# Patient Record
Sex: Male | Born: 1940 | Race: White | Hispanic: No | Marital: Married | Smoking: Never smoker
Health system: Southern US, Community
[De-identification: ages and names within clinical notes are randomized; demographics above are authoritative.]

## PROBLEM LIST (undated history)

## (undated) DIAGNOSIS — H409 Unspecified glaucoma: Secondary | ICD-10-CM

## (undated) DIAGNOSIS — C3491 Malignant neoplasm of unspecified part of right bronchus or lung: Secondary | ICD-10-CM

## (undated) DIAGNOSIS — N200 Calculus of kidney: Secondary | ICD-10-CM

## (undated) HISTORY — PX: OTHER SURGICAL HISTORY: SHX169

---

## 1999-04-03 ENCOUNTER — Ambulatory Visit (HOSPITAL_COMMUNITY): Admission: RE | Admit: 1999-04-03 | Discharge: 1999-04-03 | Payer: Self-pay | Admitting: Gastroenterology

## 2001-09-28 ENCOUNTER — Encounter: Payer: Self-pay | Admitting: Urology

## 2001-09-28 ENCOUNTER — Encounter: Admission: RE | Admit: 2001-09-28 | Discharge: 2001-09-28 | Payer: Self-pay | Admitting: Urology

## 2001-11-10 ENCOUNTER — Ambulatory Visit: Admission: RE | Admit: 2001-11-10 | Discharge: 2002-02-08 | Payer: Self-pay | Admitting: Radiation Oncology

## 2002-01-12 ENCOUNTER — Encounter: Admission: RE | Admit: 2002-01-12 | Discharge: 2002-01-12 | Payer: Self-pay | Admitting: Radiation Oncology

## 2002-05-01 ENCOUNTER — Ambulatory Visit: Admission: RE | Admit: 2002-05-01 | Discharge: 2002-05-01 | Payer: Self-pay | Admitting: Radiation Oncology

## 2002-11-17 ENCOUNTER — Ambulatory Visit (HOSPITAL_COMMUNITY): Admission: RE | Admit: 2002-11-17 | Discharge: 2002-11-17 | Payer: Self-pay | Admitting: Radiation Oncology

## 2003-02-26 ENCOUNTER — Ambulatory Visit (HOSPITAL_COMMUNITY): Admission: RE | Admit: 2003-02-26 | Discharge: 2003-02-26 | Payer: Self-pay | Admitting: Radiation Oncology

## 2003-02-26 ENCOUNTER — Encounter: Payer: Self-pay | Admitting: Radiation Oncology

## 2003-06-11 ENCOUNTER — Ambulatory Visit: Admission: RE | Admit: 2003-06-11 | Discharge: 2003-06-11 | Payer: Self-pay | Admitting: Radiation Oncology

## 2004-04-02 ENCOUNTER — Ambulatory Visit (HOSPITAL_COMMUNITY): Admission: RE | Admit: 2004-04-02 | Discharge: 2004-04-02 | Payer: Self-pay | Admitting: Gastroenterology

## 2007-12-27 ENCOUNTER — Encounter: Admission: RE | Admit: 2007-12-27 | Discharge: 2007-12-27 | Payer: Self-pay | Admitting: Family Medicine

## 2008-08-31 ENCOUNTER — Encounter: Admission: RE | Admit: 2008-08-31 | Discharge: 2008-08-31 | Payer: Self-pay | Admitting: Family Medicine

## 2009-11-22 ENCOUNTER — Ambulatory Visit (HOSPITAL_BASED_OUTPATIENT_CLINIC_OR_DEPARTMENT_OTHER): Admission: RE | Admit: 2009-11-22 | Discharge: 2009-11-22 | Payer: Self-pay | Admitting: Urology

## 2010-09-22 LAB — POCT HEMOGLOBIN-HEMACUE: Hemoglobin: 16.5 g/dL (ref 13.0–17.0)

## 2010-11-21 NOTE — Op Note (Signed)
NAME:  Wesley Coleman, Wesley Coleman NO.:  [id number]   MEDICAL RECORD NO.:  [id number]          PATIENT TYPE:  AMB   LOCATION:  ENDO                         FACILITY:  [facility]   PHYSICIAN:  James L. Malon Kindle., M.D.DATE OF BIRTH:  August 02, 1940   DATE OF PROCEDURE:  04/02/2004  DATE OF DISCHARGE:                                 OPERATIVE REPORT   PROCEDURE:  Colonoscopy.   MEDICATIONS:  Fentanyl 100 mcg, Versed 10 mg IV.   INDICATIONS:  Family history of colon cancer.   DESCRIPTION OF PROCEDURE:  Procedure had been explained to the patient and  consent obtained. Placed in the left lateral decubitus position, and Olympus  scope was inserted and advanced. Prep was excellent. Advanced around to the  cecum, and the ileocecal valve and appendiceal orifice were seen. The scope  was withdrawn to the cecum, ascending colon, transverse colon, splenic  flexure, and descending  and sigmoid colon were seen well and no polyps were  seen. Scope was withdrawn, and the patient tolerated the procedure well.   ASSESSMENT:  No polyps ______________ strong family history of colon cancer,  V16.0 .   PLAN:  Will recommend yearly hemoccults in view of his mother having had  colon cancer and polyps. Will recommend a 5 year repeat colonoscopy.      JLE/MEDQ  D:  04/02/2004  T:  04/02/2004  Job:  [id number]   cc:   Duncan Dull, M.D.  [address]  [place]  [place] [address]  Fax: [phone]

## 2011-12-20 ENCOUNTER — Emergency Department (HOSPITAL_COMMUNITY): Payer: Medicare Other

## 2011-12-20 ENCOUNTER — Encounter (HOSPITAL_COMMUNITY): Payer: Self-pay | Admitting: Emergency Medicine

## 2011-12-20 ENCOUNTER — Emergency Department (HOSPITAL_COMMUNITY)
Admission: EM | Admit: 2011-12-20 | Discharge: 2011-12-20 | Disposition: A | Discharge status: Home / Self Care | Payer: Medicare Other | Attending: Emergency Medicine | Admitting: Emergency Medicine

## 2011-12-20 DIAGNOSIS — R102 Pelvic and perineal pain: Secondary | ICD-10-CM

## 2011-12-20 DIAGNOSIS — N200 Calculus of kidney: Secondary | ICD-10-CM | POA: Insufficient documentation

## 2011-12-20 DIAGNOSIS — R109 Unspecified abdominal pain: Secondary | ICD-10-CM | POA: Insufficient documentation

## 2011-12-20 HISTORY — DX: Unspecified glaucoma: H40.9

## 2011-12-20 HISTORY — DX: Calculus of kidney: N20.0

## 2011-12-20 LAB — DIFFERENTIAL
Basophils Absolute: 0 10*3/uL (ref 0.0–0.1)
Basophils Relative: 0 % (ref 0–1)
Lymphocytes Relative: 22 % (ref 12–46)
Neutro Abs: 3.3 10*3/uL (ref 1.7–7.7)
Neutrophils Relative %: 67 % (ref 43–77)

## 2011-12-20 LAB — CBC
HCT: 44.3 % (ref 39.0–52.0)
Hemoglobin: 15.7 g/dL (ref 13.0–17.0)
MCHC: 35.4 g/dL (ref 30.0–36.0)
RDW: 12.2 % (ref 11.5–15.5)
WBC: 4.9 10*3/uL (ref 4.0–10.5)

## 2011-12-20 LAB — BASIC METABOLIC PANEL
CO2: 25 mEq/L (ref 19–32)
Calcium: 9.4 mg/dL (ref 8.4–10.5)
Chloride: 104 mEq/L (ref 96–112)
Creatinine, Ser: 1 mg/dL (ref 0.50–1.35)
GFR calc non Af Amer: 74 mL/min — ABNORMAL LOW (ref >90)
Potassium: 3.8 mEq/L (ref 3.5–5.1)

## 2011-12-20 LAB — URINALYSIS, ROUTINE W REFLEX MICROSCOPIC
Bilirubin Urine: NEGATIVE
Hgb urine dipstick: NEGATIVE
Protein, ur: NEGATIVE mg/dL
Urobilinogen, UA: 0.2 mg/dL (ref 0.0–1.0)

## 2011-12-20 MED ORDER — KETOROLAC TROMETHAMINE 30 MG/ML IJ SOLN
30.0000 mg | Freq: Once | INTRAMUSCULAR | Status: AC
  Administered 2011-12-20: 30 mg via INTRAVENOUS
  Filled 2011-12-20: qty 1

## 2011-12-20 NOTE — ED Provider Notes (Signed)
History     CSN: [id number]  Arrival date & time 12/20/11  7829   First MD Initiated Contact with Patient 12/20/11 [phone]      Chief Complaint  Patient presents with  . Groin Pain    (Consider location/radiation/quality/duration/timing/severity/associated sxs/prior treatment) Patient is a 71 y.o. male presenting with groin pain. The history is provided by the patient.  Groin Pain  He has a history of having had multiple kidney stones in the past. 4 days ago, he noted some burning in the suprapubic area which was mild to moderate. He rated at a 4/10. There was associated urinary hesitancy but no dysuria. The pain subsided but is still present and he rates the pain at 2/10 currently. He has noted over the last 24 hours that he has worsening urinary hesitancy and some drooling. He continues to deny dysuria. Denies any flank pain. There has been no nausea or vomiting and no fever or chills. He has not taken any medication. Nothing makes his pain better nothing makes it worse.  Past Medical History  Diagnosis Date  . Kidney stones   . Glaucoma     History reviewed. No pertinent past surgical history.  History reviewed. No pertinent family history.  History  Substance Use Topics  . Smoking status: Not on file  . Smokeless tobacco: Not on file  . Alcohol Use:       Review of Systems  All other systems reviewed and are negative.    Allergies  Dye fdc red; Erythromycin; and Penicillins  Home Medications   Current Outpatient Rx  Name Route Sig Dispense Refill  . CLONAZEPAM 2 MG PO TABS Oral Take 2 mg by mouth at bedtime as needed. For sleep    . PRAVASTATIN SODIUM 10 MG PO TABS Oral Take 10 mg by mouth daily. Take one tablet every other day    . TIMOLOL MALEATE 0.5 % OP SOLN Right Eye Place 1 drop into the right eye 2 (two) times daily.      BP 110/75  Pulse 72  Temp 97.4 F (36.3 C) (Oral)  Resp 18  SpO2 94%  Physical Exam  Nursing note and vitals reviewed.  71 year old male who is resting comfortably and in no acute distress. Vital signs are normal. Oxygen saturation is 94% which is normal. Head is normocephalic and atraumatic. PERRLA, EOMI. Neck is nontender and supple. Back is nontender. Lungs are clear without rales, wheezes, or rhonchi. Heart has regular rate and rhythm without murmur. Abdomen is soft, flat, nontender without masses or hepatosplenomegaly. There is no CVA tenderness. There is no inguinal adenopathy. Jimmy's have no cyanosis or edema, full range of motion is present. Skin is warm and dry without rash. Neurologic: Mental status is normal, cranial nerves are intact, there are no motor or sensory deficits.  ED Course  Procedures (including critical care time)  Results for orders placed during the hospital encounter of 12/20/11  CBC      Component Value Range   WBC 4.9  4.0 - 10.5 K/uL   RBC 4.88  4.22 - 5.81 MIL/uL   Hemoglobin 15.7  13.0 - 17.0 g/dL   HCT 30.8  65.7 - 84.6 %   MCV 90.8  78.0 - 100.0 fL   MCH 32.2  26.0 - 34.0 pg   MCHC 35.4  30.0 - 36.0 g/dL   RDW 96.2  95.2 - 84.1 %   Platelets 220  150 - 400 K/uL  DIFFERENTIAL  Component Value Range   Neutrophils Relative 67  43 - 77 %   Neutro Abs 3.3  1.7 - 7.7 K/uL   Lymphocytes Relative 22  12 - 46 %   Lymphs Abs 1.1  0.7 - 4.0 K/uL   Monocytes Relative 9  3 - 12 %   Monocytes Absolute 0.4  0.1 - 1.0 K/uL   Eosinophils Relative 2  0 - 5 %   Eosinophils Absolute 0.1  0.0 - 0.7 K/uL   Basophils Relative 0  0 - 1 %   Basophils Absolute 0.0  0.0 - 0.1 K/uL  BASIC METABOLIC PANEL      Component Value Range   Sodium 138  135 - 145 mEq/L   Potassium 3.8  3.5 - 5.1 mEq/L   Chloride 104  96 - 112 mEq/L   CO2 25  19 - 32 mEq/L   Glucose, Bld 104 (*) 70 - 99 mg/dL   BUN 11  6 - 23 mg/dL   Creatinine, Ser 1.61  0.50 - 1.35 mg/dL   Calcium 9.4  8.4 - 09.6 mg/dL   GFR calc non Af Amer 74 (*) >90 mL/min   GFR calc Af Amer 86 (*) >90 mL/min  URINALYSIS, ROUTINE W  REFLEX MICROSCOPIC      Component Value Range   Color, Urine YELLOW  YELLOW   APPearance CLEAR  CLEAR   Specific Gravity, Urine 1.009  1.005 - 1.030   pH 6.5  5.0 - 8.0   Glucose, UA NEGATIVE  NEGATIVE mg/dL   Hgb urine dipstick NEGATIVE  NEGATIVE   Bilirubin Urine NEGATIVE  NEGATIVE   Ketones, ur NEGATIVE  NEGATIVE mg/dL   Protein, ur NEGATIVE  NEGATIVE mg/dL   Urobilinogen, UA 0.2  0.0 - 1.0 mg/dL   Nitrite NEGATIVE  NEGATIVE   Leukocytes, UA NEGATIVE  NEGATIVE   Ct Abdomen Pelvis Wo Contrast  12/20/2011  *RADIOLOGY REPORT*  Clinical Data: Groin pain.  Prior prostate cancer with radiation therapy.  Suprapubic pain and dysuria.  History of kidney stones.  CT ABDOMEN AND PELVIS WITHOUT CONTRAST  Technique:  Multidetector CT imaging of the abdomen and pelvis was performed following the standard protocol without intravenous contrast.  Comparison: Abdominal radiograph 09/01/2011  Findings: Minimal dependent bilateral atelectasis.  Lung bases are otherwise clear.  Bilateral nonobstructing renal calculi are again noted, largest in the left mid kidney measuring 7 mm image 32 and largest right mid kidney measuring 8 mm image 34.  No perinephric fluid collection. Minimal nonspecific perinephric stranding is noted.  No hydroureteronephrosis.  The bladder is normal.  No radiopaque ureteral or bladder calculus.  Unenhanced liver, gallbladder, adrenal glands, spleen, and pancreas are normal.  No ascites or lymphadenopathy.  No free air.  Bladder and bowel are unremarkable.  Appendix is normal.  Lumbar spine degenerative changes is present.  No acute osseous finding. No lytic or sclerotic osseous lesion.  IMPRESSION: No acute intra-abdominal or pelvic pathology.  Bilateral radiopaque nonobstructing renal calculi.  Nonspecific perinephric stranding.  Original Report Authenticated By: Harrel Lemon, M.D.      1. Pelvic pain       MDM  Suprapubic pain which may indeed represent ureterolithiasis but  need to consider possibility of urinary tract infection. Urinalysis will be obtained as well CT scan. I reviewed his prior imaging and he does have known kidney stones bilaterally. Plain abdominal films show multiple calcifications so I do not believe that plain film will be sufficient to assess whether  his symptoms are due to ureterolithiasis.   Workup is unremarkable. No evidence of nephrolithiasis, ureterolithiasis, or urinary tract infection. He is referred back to his urologist for further evaluation.     Dione Booze, MD 12/20/11 1159

## 2011-12-20 NOTE — ED Notes (Signed)
Pt c/o groin pain x 1 week, worse today.  Pt reports h/o kidney stones and states this pain feels the same.

## 2011-12-20 NOTE — Discharge Instructions (Signed)
Your CAT scan did not show any evidence of a kidney stone, and your urine did not show any evidence of infection. Your blood tests were all normal. I don't have an explanation for why you're having your pain and trouble urinating. Please make an appointment with your urologist for further evaluation, and return to the emergency department if symptoms get worse.

## 2011-12-22 ENCOUNTER — Ambulatory Visit (INDEPENDENT_AMBULATORY_CARE_PROVIDER_SITE_OTHER): Payer: Medicare Other | Admitting: Urology

## 2011-12-22 DIAGNOSIS — N200 Calculus of kidney: Secondary | ICD-10-CM

## 2011-12-22 DIAGNOSIS — C61 Malignant neoplasm of prostate: Secondary | ICD-10-CM

## 2011-12-22 DIAGNOSIS — N210 Calculus in bladder: Secondary | ICD-10-CM

## 2012-09-20 ENCOUNTER — Ambulatory Visit (INDEPENDENT_AMBULATORY_CARE_PROVIDER_SITE_OTHER): Payer: Medicare Other | Admitting: Urology

## 2012-09-20 DIAGNOSIS — N529 Male erectile dysfunction, unspecified: Secondary | ICD-10-CM

## 2012-09-20 DIAGNOSIS — C61 Malignant neoplasm of prostate: Secondary | ICD-10-CM

## 2013-05-04 ENCOUNTER — Ambulatory Visit: Payer: Medicare Other | Admitting: Interventional Cardiology

## 2013-05-04 ENCOUNTER — Telehealth: Payer: Self-pay | Admitting: Interventional Cardiology

## 2013-05-04 NOTE — Telephone Encounter (Signed)
New Problem:  Pt states he missed a call and he wasn't sure if Amy was trying to call him. I informed the pt of his appt. Please advise

## 2013-05-04 NOTE — Telephone Encounter (Signed)
Called pt back and let him know I didn't call him but that he does have an appt on 05/09/13.

## 2013-05-08 DIAGNOSIS — N200 Calculus of kidney: Secondary | ICD-10-CM | POA: Insufficient documentation

## 2013-05-08 DIAGNOSIS — H409 Unspecified glaucoma: Secondary | ICD-10-CM | POA: Insufficient documentation

## 2013-05-09 ENCOUNTER — Encounter: Payer: Self-pay | Admitting: Interventional Cardiology

## 2013-05-09 ENCOUNTER — Ambulatory Visit: Payer: Medicare Other | Admitting: Interventional Cardiology

## 2013-05-09 ENCOUNTER — Ambulatory Visit (INDEPENDENT_AMBULATORY_CARE_PROVIDER_SITE_OTHER): Payer: Medicare Other | Admitting: Interventional Cardiology

## 2013-05-09 VITALS — BP 126/74 | HR 73 | Ht 68.5 in | Wt 173.0 lb

## 2013-05-09 DIAGNOSIS — R0602 Shortness of breath: Secondary | ICD-10-CM

## 2013-05-09 DIAGNOSIS — R9430 Abnormal result of cardiovascular function study, unspecified: Secondary | ICD-10-CM | POA: Insufficient documentation

## 2013-05-09 DIAGNOSIS — Z0389 Encounter for observation for other suspected diseases and conditions ruled out: Secondary | ICD-10-CM

## 2013-05-09 DIAGNOSIS — E782 Mixed hyperlipidemia: Secondary | ICD-10-CM

## 2013-05-09 DIAGNOSIS — R079 Chest pain, unspecified: Secondary | ICD-10-CM

## 2013-05-09 NOTE — Patient Instructions (Signed)
Your physician recommends that you continue on your current medications as directed. Please refer to the Current Medication list given to you today.  Your physician wants you to follow-up in: 1 year with Dr. Varanasi. You will receive a reminder letter in the mail two months in advance. If you don't receive a letter, please call our office to schedule the follow-up appointment.  

## 2013-05-09 NOTE — Progress Notes (Signed)
Patient ID: Wesley Coleman, male   DOB: Apr 30, 1941, 72 y.o.   MRN: [id number]    [address] [place], [place]  [address] Phone: [phone] Fax:  [phone]  Date:  05/09/2013   ID:  Wesley Coleman, DOB December 14, 1940, MRN [id number]  PCP:  Ginette Otto, MD      History of Present Illness: Wesley Coleman is a 72 y.o. male who has had an abnormal stress test in the past. He has some difficulty breathing at times. He does walk and jogs in place at times. He has some upper abdominal pain that resolves as he continues to exercise. He has noted some irregular heart beats. He has decreased his caffeine intake. He walks several days a week.  He is concerned that pravastatin is causing his side effects. On rare occasions,he has some CP with walking, but this is relieved while he continues to walk. Rare palpitations.  He has been Environmental consultant.  He fell of of a ladder recently. No broken bones.     Wt Readings from Last 3 Encounters:  05/09/13 173 lb (78.472 kg)     Past Medical History  Diagnosis Date  . Kidney stones   . Glaucoma     Current Outpatient Prescriptions  Medication Sig Dispense Refill  . clonazePAM (KLONOPIN) 2 MG tablet Take 1 mg by mouth at bedtime as needed. For sleep      . HYDROcodone-acetaminophen (NORCO) 10-325 MG per tablet Take 1 tablet by mouth every 6 (six) hours as needed.      . pravastatin (PRAVACHOL) 10 MG tablet Take 10 mg by mouth daily. Take one tablet every other day      . timolol (TIMOPTIC) 0.5 % ophthalmic solution Place 1 drop into the right eye 2 (two) times daily.       No current facility-administered medications for this visit.    Allergies:    Allergies  Allergen Reactions  . Dye Fdc Red [Red Dye]     Had reaction to dye treatment from kidney stones  . Erythromycin Nausea And Vomiting  . Penicillins Hives    Social History:  The patient  reports that he has never smoked. He does not have any smokeless tobacco  history on file. He reports that he drinks alcohol. He reports that he does not use illicit drugs.   Family History:  The patient's family history includes Colon cancer in his mother; Diabetes in his paternal grandmother; Heart Problems in his maternal grandmother.   ROS:  Please see the history of present illness.  No nausea, vomiting.  No fevers, chills.  No focal weakness.  No dysuria.   All other systems reviewed and negative.   PHYSICAL EXAM: VS:  BP 126/74  Pulse 73  Ht 5' 8.5" (1.74 m)  Wt 173 lb (78.472 kg)  BMI 25.92 kg/m2 Well nourished, well developed, in no acute distress HEENT: normal Neck: no JVD, no carotid bruits Cardiac:  normal S1, S2; RRR;  Lungs:  clear to auscultation bilaterally, no wheezing, rhonchi or rales Abd: soft, nontender, no hepatomegaly, no pulsatile mass Ext: no edema Skin: warm and dry Neuro:   no focal abnormalities noted  EKG:  Normal     ASSESSMENT AND PLAN:  Shortness of breath on exertion  retested for ischemia in 11/13.  Still with question of basal to mid inferior ischemia by stress test. We've not pursued cardiac cath because his symptoms have been minimal and managed with medicines. COntinue  to try to be active. LDL 100 in 10/13. LDL 83 in 10/14. Only takng pravastatin 3-4 x /week. SHOB not limiting him. 2. Abnormal cardiovascular function study  Continue Aspirin Tablet, 81 MG, 1 tablet, Orally, Occ. 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 No significant exertion sx.  Still not interested in cath at this time. 3. Palpitations  Getting better. Continue to avoid caffeine. Sx resolved. Rare, only with excessive caffeine. 4. Chest pain, unspecified  No significant exertional pain.  He does have some chest pain in the left arm pit; and some shortlived chest pain with anxiety.  Atypical.  We discussed cardiac cath but he does not want to pursue that now.    Signed, Fredric Mare, MD, [facility] 05/09/2013 10:49 AM

## 2013-07-13 ENCOUNTER — Other Ambulatory Visit: Payer: Self-pay | Admitting: Geriatric Medicine

## 2013-07-13 ENCOUNTER — Ambulatory Visit
Admission: RE | Admit: 2013-07-13 | Discharge: 2013-07-13 | Disposition: A | Discharge status: Home / Self Care | Payer: Medicare Other | Source: Ambulatory Visit | Attending: Geriatric Medicine | Admitting: Geriatric Medicine

## 2013-07-13 DIAGNOSIS — M549 Dorsalgia, unspecified: Secondary | ICD-10-CM

## 2013-07-20 ENCOUNTER — Other Ambulatory Visit: Payer: Self-pay | Admitting: Geriatric Medicine

## 2013-07-20 ENCOUNTER — Ambulatory Visit
Admission: RE | Admit: 2013-07-20 | Discharge: 2013-07-20 | Disposition: A | Discharge status: Home / Self Care | Payer: Medicare Other | Source: Ambulatory Visit | Attending: Geriatric Medicine | Admitting: Geriatric Medicine

## 2013-07-20 DIAGNOSIS — M25552 Pain in left hip: Secondary | ICD-10-CM

## 2013-07-20 DIAGNOSIS — M545 Low back pain, unspecified: Secondary | ICD-10-CM

## 2013-07-20 DIAGNOSIS — R0781 Pleurodynia: Secondary | ICD-10-CM

## 2013-07-20 DIAGNOSIS — M898X5 Other specified disorders of bone, thigh: Secondary | ICD-10-CM

## 2013-07-20 DIAGNOSIS — W19XXXA Unspecified fall, initial encounter: Secondary | ICD-10-CM

## 2014-01-09 ENCOUNTER — Telehealth: Payer: Self-pay | Admitting: Interventional Cardiology

## 2014-01-09 ENCOUNTER — Telehealth: Payer: Self-pay | Admitting: *Deleted

## 2014-01-09 MED ORDER — PRAVASTATIN SODIUM 20 MG PO TABS: ORAL_TABLET | ORAL | Status: DC

## 2014-01-09 MED ORDER — ASPIRIN EC 81 MG PO TBEC
81.0000 mg | DELAYED_RELEASE_TABLET | Freq: Every day | ORAL | Status: AC
Start: 2014-01-09 — End: ?

## 2014-01-09 NOTE — Telephone Encounter (Signed)
Returned pts call, see other telephone note.

## 2014-01-09 NOTE — Telephone Encounter (Signed)
Spoke with pt and he has been taking Pravastatin 20 mg 3-4 x a week. Per Dr. Hassell Done last note he is aware pt is only taking 3-4 x a week. Dosage was most likely added into epic incorrectly. Refilled medication.

## 2014-01-09 NOTE — Telephone Encounter (Addendum)
Lm for pt to call to verify dosage. [facility] shows Pravastatin 20 mg qod.

## 2014-01-09 NOTE — Telephone Encounter (Signed)
[facility] requests pravastatin 20mg  refill for patient, but the chart has 10mg . Can you clarify which one patient is to be on? Thanks, MI

## 2014-01-09 NOTE — Telephone Encounter (Signed)
New message ° ° ° °Returning Amy's call °

## 2014-07-17 ENCOUNTER — Encounter: Payer: Self-pay | Admitting: *Deleted

## 2014-07-19 ENCOUNTER — Ambulatory Visit (INDEPENDENT_AMBULATORY_CARE_PROVIDER_SITE_OTHER): Payer: Medicare Other | Admitting: Interventional Cardiology

## 2014-07-19 ENCOUNTER — Encounter: Payer: Self-pay | Admitting: Interventional Cardiology

## 2014-07-19 VITALS — BP 114/78 | HR 64 | Ht 68.0 in | Wt 173.0 lb

## 2014-07-19 DIAGNOSIS — R9430 Abnormal result of cardiovascular function study, unspecified: Secondary | ICD-10-CM

## 2014-07-19 DIAGNOSIS — E782 Mixed hyperlipidemia: Secondary | ICD-10-CM

## 2014-07-19 NOTE — Patient Instructions (Signed)
Your physician recommends that you continue on your current medications as directed. Please refer to the Current Medication list given to you today.  Your physician wants you to follow-up in: 1 year with Dr. Varanasi. You will receive a reminder letter in the mail two months in advance. If you don't receive a letter, please call our office to schedule the follow-up appointment.  

## 2014-07-19 NOTE — Progress Notes (Signed)
Patient ID: Wesley Coleman, male   DOB: [birth year], 75 y.o.   MRN: [id number] Patient ID: Wesley Coleman, male   DOB: 03-04-1941, 74 y.o.   MRN: [id number]    [place] [place], [place]  [address] Phone: [phone] Fax:  [phone]  Date:  07/19/2014   ID:  Wesley Coleman, DOB 04-29-1941, MRN [id number]  PCP:  Mathews Argyle, MD      History of Present Illness: Wesley Coleman is a 74 y.o. male who has had an abnormal stress test in the past. He has some difficulty breathing at times- worse with stepping into the cold air. He does walk and work in the yard without problems. He has some upper abdominal pain with over eating.  It does not limit his exercise. He has noted some irregular heart beats. He has decreased his caffeine intake. He walks several days a week.  He is concerned that pravastatin is causing his side effects. He has decreased the frequency of his lipid lowering therapy and is tolerating this well.   He was started on Sildenafil with success for erectile dysfunction.  He is not using nitrates.  He feels some palpitations after taking the sildenafil, but resolves within a minute.   He does not feel that he needs a cath at this time.     Wt Readings from Last 3 Encounters:  07/19/14 173 lb (78.472 kg)  05/09/13 173 lb (78.472 kg)     Past Medical History  Diagnosis Date  . Kidney stones   . Glaucoma     Current Outpatient Prescriptions  Medication Sig Dispense Refill  . aspirin EC 81 MG tablet Take 1 tablet (81 mg total) by mouth daily. (Patient taking differently: Take 81 mg by mouth as needed. ) 90 tablet 3  . clonazePAM (KLONOPIN) 2 MG tablet Take 1 mg by mouth at bedtime as needed. For sleep    . Dorzolamide HCl-Timolol Mal PF 22.3-6.8 MG/ML SOLN Apply 22.3 mg to eye 2 (two) times daily. One drop in both eyes.    Marland Kitchen HYDROcodone-acetaminophen (NORCO) 10-325 MG per tablet Take 1 tablet by mouth every 6 (six) hours as needed.    .  pravastatin (PRAVACHOL) 20 MG tablet Take one tablet every other day 45 tablet 1  . sildenafil (REVATIO) 20 MG tablet Take 20 mg by mouth as needed.     No current facility-administered medications for this visit.    Allergies:    Allergies  Allergen Reactions  . Dye Fdc Red [Red Dye]     Had reaction to dye treatment from kidney stones  . Erythromycin Nausea And Vomiting  . Penicillins Hives    Social History:  The patient  reports that he has never smoked. He does not have any smokeless tobacco history on file. He reports that he drinks alcohol. He reports that he does not use illicit drugs.   Family History:  The patient's family history includes Colon cancer in his mother; Diabetes in his paternal grandmother; Heart Problems in his maternal grandmother.   ROS:  Please see the history of present illness.  No nausea, vomiting.  No fevers, chills.  No focal weakness.  No dysuria.   All other systems reviewed and negative.   PHYSICAL EXAM: VS:  BP 114/78 mmHg  Pulse 64  Ht 5\' 8"  (1.727 m)  Wt 173 lb (78.472 kg)  BMI 26.31 kg/m2 Well nourished, well developed, in no acute distress HEENT: normal Neck:  no JVD, no carotid bruits Cardiac:  normal S1, S2; RRR;  Lungs:  clear to auscultation bilaterally, no wheezing, rhonchi or rales Abd: soft, nontender, no hepatomegaly, no pulsatile mass Ext: no edema Skin: warm and dry Neuro:   no focal abnormalities noted Psych: normal affect-  EKG:  Normal     ASSESSMENT AND PLAN:  Shortness of breath on exertion - improved  retested for ischemia in 11/13.  Still with question of basal to mid inferior ischemia by stress test. We've not pursued cardiac cath because his symptoms have been minimal and managed with medicines. COntinue to try to be active. LDL 100 in 10/13. LDL 83 in 10/14. Only taking pravastatin 3-4 x /week. SHOB better, not limiting him. 2. Abnormal cardiovascular function study  Continue Aspirin Tablet, 81 MG, 1 tablet,  Orally, daily..he is taking it several times a week.. Prior reversible defect in the basal inferior wall. No significant exertion sx.  Still not interested in cath at this time.   3. Palpitations  Getting better. Continue to avoid caffeine. Sx resolved. Rare, only with excessive caffeine. 4. Chest pain, unspecified  No significant exertional pain. Some Atypical pain.  We discussed cardiac cath but he does not want to pursue that now.    Signed, Mina Marble, MD, [facility] 07/19/2014 11:57 AM

## 2015-03-20 ENCOUNTER — Other Ambulatory Visit: Payer: Self-pay | Admitting: Interventional Cardiology

## 2015-12-31 ENCOUNTER — Other Ambulatory Visit: Payer: Self-pay | Admitting: Interventional Cardiology

## 2016-01-02 ENCOUNTER — Other Ambulatory Visit: Payer: Self-pay | Admitting: Interventional Cardiology

## 2016-01-27 ENCOUNTER — Other Ambulatory Visit: Payer: Self-pay | Admitting: Geriatric Medicine

## 2016-01-27 DIAGNOSIS — R9389 Abnormal findings on diagnostic imaging of other specified body structures: Secondary | ICD-10-CM

## 2016-01-29 ENCOUNTER — Ambulatory Visit
Admission: RE | Admit: 2016-01-29 | Discharge: 2016-01-29 | Disposition: A | Discharge status: Home / Self Care | Payer: Medicare Other | Source: Ambulatory Visit | Attending: Geriatric Medicine | Admitting: Geriatric Medicine

## 2016-01-29 DIAGNOSIS — R9389 Abnormal findings on diagnostic imaging of other specified body structures: Secondary | ICD-10-CM

## 2016-01-29 MED ORDER — IOPAMIDOL (ISOVUE-300) INJECTION 61%
75.0000 mL | Freq: Once | INTRAVENOUS | Status: AC | PRN
  Administered 2016-01-29: 75 mL via INTRAVENOUS

## 2016-01-30 ENCOUNTER — Other Ambulatory Visit (HOSPITAL_COMMUNITY): Payer: Self-pay | Admitting: Geriatric Medicine

## 2016-01-30 DIAGNOSIS — R918 Other nonspecific abnormal finding of lung field: Secondary | ICD-10-CM

## 2016-01-31 ENCOUNTER — Telehealth: Payer: Self-pay | Admitting: *Deleted

## 2016-01-31 ENCOUNTER — Encounter: Payer: Self-pay | Admitting: *Deleted

## 2016-01-31 DIAGNOSIS — R918 Other nonspecific abnormal finding of lung field: Secondary | ICD-10-CM | POA: Insufficient documentation

## 2016-01-31 NOTE — Telephone Encounter (Signed)
Oncology Nurse Navigator Documentation  Oncology Nurse Navigator Flowsheets 01/31/2016  Navigator Encounter Type Introductory phone call  Treatment Phase Pre-Tx/Tx Discussion  Barriers/Navigation Needs Coordination of Care  Interventions Coordination of Care  Coordination of Care Appts  Acuity Level 1  Acuity Level 1 Initial guidance, education and coordination as needed  Time Spent with Patient 15   I received a referral on Mr. Ramsay yesterday from Dr. Julien Nordmann.  I called to schedule him.  I was unable to reach. I did leave a vm message with my name and phone number to call.

## 2016-02-03 ENCOUNTER — Telehealth: Payer: Self-pay | Admitting: *Deleted

## 2016-02-03 NOTE — Telephone Encounter (Signed)
Mailed new pt clinic letter to pt.  

## 2016-02-04 ENCOUNTER — Telehealth: Payer: Self-pay | Admitting: Medical Oncology

## 2016-02-04 NOTE — Telephone Encounter (Signed)
I forwarded call to schedulers-karlene

## 2016-02-05 ENCOUNTER — Telehealth: Payer: Self-pay | Admitting: *Deleted

## 2016-02-05 NOTE — Telephone Encounter (Signed)
Pt returned missed call.  Gave pt appts date and time for 02/06/16.  Pt voiced understanding.

## 2016-02-05 NOTE — Telephone Encounter (Signed)
Called pt and left a friendly reminder message about his clinic appt for 02/06/16.

## 2016-02-06 ENCOUNTER — Ambulatory Visit (HOSPITAL_COMMUNITY)
Admission: RE | Admit: 2016-02-06 | Discharge: 2016-02-06 | Disposition: A | Discharge status: Home / Self Care | Payer: Medicare Other | Source: Ambulatory Visit | Attending: Geriatric Medicine | Admitting: Geriatric Medicine

## 2016-02-06 ENCOUNTER — Other Ambulatory Visit: Payer: Self-pay

## 2016-02-06 ENCOUNTER — Encounter: Payer: Self-pay | Admitting: Cardiothoracic Surgery

## 2016-02-06 ENCOUNTER — Institutional Professional Consult (permissible substitution) (INDEPENDENT_AMBULATORY_CARE_PROVIDER_SITE_OTHER): Payer: Medicare Other | Admitting: Cardiothoracic Surgery

## 2016-02-06 ENCOUNTER — Ambulatory Visit: Payer: Medicare Other | Admitting: Physical Therapy

## 2016-02-06 ENCOUNTER — Other Ambulatory Visit (HOSPITAL_BASED_OUTPATIENT_CLINIC_OR_DEPARTMENT_OTHER): Payer: Medicare Other

## 2016-02-06 ENCOUNTER — Ambulatory Visit (HOSPITAL_BASED_OUTPATIENT_CLINIC_OR_DEPARTMENT_OTHER): Payer: Medicare Other | Admitting: Internal Medicine

## 2016-02-06 ENCOUNTER — Encounter: Payer: Self-pay | Admitting: Internal Medicine

## 2016-02-06 ENCOUNTER — Other Ambulatory Visit: Payer: Self-pay | Admitting: Medical Oncology

## 2016-02-06 VITALS — BP 133/87 | HR 90 | Temp 98.6°F | Resp 18 | Wt 174.2 lb

## 2016-02-06 VITALS — BP 133/87 | HR 90 | Temp 98.6°F | Resp 18 | Ht 68.0 in | Wt 174.2 lb

## 2016-02-06 DIAGNOSIS — J90 Pleural effusion, not elsewhere classified: Secondary | ICD-10-CM

## 2016-02-06 DIAGNOSIS — C7951 Secondary malignant neoplasm of bone: Secondary | ICD-10-CM

## 2016-02-06 DIAGNOSIS — R918 Other nonspecific abnormal finding of lung field: Secondary | ICD-10-CM

## 2016-02-06 DIAGNOSIS — C3490 Malignant neoplasm of unspecified part of unspecified bronchus or lung: Secondary | ICD-10-CM | POA: Diagnosis not present

## 2016-02-06 DIAGNOSIS — C771 Secondary and unspecified malignant neoplasm of intrathoracic lymph nodes: Secondary | ICD-10-CM | POA: Diagnosis not present

## 2016-02-06 DIAGNOSIS — E042 Nontoxic multinodular goiter: Secondary | ICD-10-CM | POA: Insufficient documentation

## 2016-02-06 LAB — CBC WITH DIFFERENTIAL/PLATELET
BASO%: 0.2 % (ref 0.0–2.0)
BASOS ABS: 0 10*3/uL (ref 0.0–0.1)
EOS ABS: 0.9 10*3/uL — AB (ref 0.0–0.5)
EOS%: 7.1 % — AB (ref 0.0–7.0)
HCT: 41 % (ref 38.4–49.9)
HGB: 14.2 g/dL (ref 13.0–17.1)
LYMPH%: 11.7 % — AB (ref 14.0–49.0)
MCH: 30.7 pg (ref 27.2–33.4)
MCHC: 34.6 g/dL (ref 32.0–36.0)
MCV: 88.6 fL (ref 79.3–98.0)
MONO#: 1.3 10*3/uL — ABNORMAL HIGH (ref 0.1–0.9)
MONO%: 10.8 % (ref 0.0–14.0)
NEUT#: 8.8 10*3/uL — ABNORMAL HIGH (ref 1.5–6.5)
NEUT%: 70.2 % (ref 39.0–75.0)
Platelets: 318 10*3/uL (ref 140–400)
RBC: 4.63 10*6/uL (ref 4.20–5.82)
RDW: 12.3 % (ref 11.0–14.6)
WBC: 12.4 10*3/uL — ABNORMAL HIGH (ref 4.0–10.3)
lymph#: 1.5 10*3/uL (ref 0.9–3.3)

## 2016-02-06 LAB — GLUCOSE, CAPILLARY: Glucose-Capillary: 114 mg/dL — ABNORMAL HIGH (ref 65–99)

## 2016-02-06 LAB — COMPREHENSIVE METABOLIC PANEL
ALK PHOS: 159 U/L — AB (ref 40–150)
ALT: 35 U/L (ref 0–55)
AST: 28 U/L (ref 5–34)
Albumin: 3.3 g/dL — ABNORMAL LOW (ref 3.5–5.0)
Anion Gap: 11 mEq/L (ref 3–11)
BUN: 12.8 mg/dL (ref 7.0–26.0)
CHLORIDE: 103 meq/L (ref 98–109)
CO2: 24 meq/L (ref 22–29)
Calcium: 9.5 mg/dL (ref 8.4–10.4)
Creatinine: 1.2 mg/dL (ref 0.7–1.3)
EGFR: 60 mL/min/{1.73_m2} — AB (ref >90)
GLUCOSE: 107 mg/dL (ref 70–140)
POTASSIUM: 4 meq/L (ref 3.5–5.1)
SODIUM: 138 meq/L (ref 136–145)
Total Bilirubin: 0.67 mg/dL (ref 0.20–1.20)
Total Protein: 8.1 g/dL (ref 6.4–8.3)

## 2016-02-06 MED ORDER — FLUDEOXYGLUCOSE F - 18 (FDG) INJECTION
8.6900 | Freq: Once | INTRAVENOUS | Status: AC | PRN
  Administered 2016-02-06: 8.69 via INTRAVENOUS

## 2016-02-06 NOTE — Progress Notes (Signed)
[address]       [place] [address]             [phone]                    Gerrod F Nesheiwat [place] Medical Record #[id number] Date of Birth: [birth year]  Referring: Lajean Manes, MD Primary Care: Mathews Argyle, MD  Chief Complaint:   No chief complaint on file.   History of Present Illness:    Wesley Coleman 75 y.o. 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 The patient does give a vague history of treatment with radioactive iodine at age 62.  Has a history of prostate cancer treated with radiation in 2001. He has had abnormal stress test in the past followed by Dr. Irish Lack, has never had a cardiac catheterization.   Because of increasing cough and congestion and shortness of breath a chest x-ray was obtained, which was abnormal leading to a CT scan of the chest and PET scan.  Abnormal cardiovascular function study  Continue Aspirin Tablet, 81 MG, 1 tablet, Orally, daily..he is taking it several times a week.. Prior reversible defect in the basal inferior wall. No significant exertion sx.  Still not interested in cath at this time Current Activity/ Functional Status:  Patient is independent with mobility/ambulation, transfers, ADL's, IADL's.   Zubrod Score: At the time of surgery this patient's most appropriate activity status/level should be described as: '[]'$     0    Normal activity, no symptoms '[x]'$     1    Restricted in physical strenuous activity but ambulatory, able to do out light work '[]'$     2    Ambulatory and capable of self care, unable to do work activities, up and about               >50 % of waking hours                              '[]'$     3    Only limited self care, in bed greater than 50% of waking hours '[]'$     4    Completely disabled, no self care, confined to bed or chair '[]'$     5    Moribund   Past Medical History:  Diagnosis Date  . Glaucoma     . Kidney stones     Past Surgical History:  Procedure Laterality Date  . none      Family History  Problem Relation Age of Onset  . Diabetes Paternal Grandmother   . Colon cancer Mother   . Heart Problems Maternal Grandmother     Social History   Social History  . Marital status: Married    Spouse name: N/A  . Number of children: N/A  . Years of education: N/A   Occupational History  . Not on file.   Social History Main Topics  . Smoking status: Never Smoker  . Smokeless tobacco: Never Used  . Alcohol use Yes     Comment: one beer once in a while  . Drug use: No  . Sexual activity: Not on file   Other Topics Concern  . Not on file   Social History Narrative  . No narrative on file    History  Smoking  Status  . Never Smoker  Smokeless Tobacco  . Never Used    History  Alcohol Use  . Yes    Comment: one beer once in a while     Allergies  Allergen Reactions  . Dye Fdc Red [Red Dye]     Had reaction to dye treatment from kidney stones  . Erythromycin Nausea And Vomiting  . Penicillins Hives    Current Outpatient Prescriptions  Medication Sig Dispense Refill  . aspirin EC 81 MG tablet Take 1 tablet (81 mg total) by mouth daily. (Patient taking differently: Take 81 mg by mouth as needed. ) 90 tablet 3  . clonazePAM (KLONOPIN) 2 MG tablet Take 1 mg by mouth at bedtime as needed. For sleep    . Dorzolamide HCl-Timolol Mal PF 22.3-6.8 MG/ML SOLN Apply 22.3 mg to eye 2 (two) times daily. One drop in both eyes.    . pravastatin (PRAVACHOL) 20 MG tablet TAKE ONE TABLET BY MOUTH EVERY OTHER DAY 45 tablet 0  . sildenafil (REVATIO) 20 MG tablet Take 20 mg by mouth as needed.     No current facility-administered medications for this visit.       Review of Systems:     Cardiac Review of Systems: Y or N  Chest Pain [  n  ]  Resting SOB [ y  ] Exertional SOB  Blue.Reese  ]  Orthopnea Blue.Reese  ]   Pedal Edema [  n ]    Palpitations [ y ] Syncope  [ n ]   Presyncope [   n ]  General Review of Systems: [Y] = yes [  ]=no Constitional: recent weight change [n  ];  Wt loss over the last 3 months [   ] anorexia Blue.Reese  ]; fatigue [ y ]; nausea [  ]; night sweats [  ]; fever [  ]; or chills [  ];          Dental: poor dentition[  ]; Last Dentist visit:   Eye : blurred vision [  ]; diplopia [   ]; vision changes [  ];  Amaurosis fugax[  ]; Resp: cough [  ];  wheezing[  ];  hemoptysis[  ]; shortness of breath[  ]; paroxysmal nocturnal dyspnea[  ]; dyspnea on exertion[  ]; or orthopnea[  ];  GI:  gallstones[  ], vomiting[  ];  dysphagia[  ]; melena[  ];  hematochezia [  ]; heartburn[  ];   Hx of  Colonoscopy[  ]; GU: kidney stones [  ]; hematuria[  ];   dysuria [  ];  nocturia[  ];  history of     obstruction [  ]; urinary frequency [  ]             Skin: rash, swelling[  ];, hair loss[  ];  peripheral edema[  ];  or itching[  ]; Musculosketetal: myalgias[  ];  joint swelling[  ];  joint erythema[  ];  joint pain[  ];  back pain[  ];  Heme/Lymph: bruising[  ];  bleeding[  ];  anemia[  ];  Neuro: TIA[  ];  headaches[  ];  stroke[  ];  vertigo[  ];  seizures[  ];   paresthesias[  ];  difficulty walking[ n ];  Psych:depression[  ]; anxiety[  ];  Endocrine: diabetes[  ];  thyroid dysfunction[  ];  Immunizations: Flu up to date Florencio.Farrier  ]; Pneumococcal up to date Florencio.Farrier  ];  Other:  Physical Exam: BP 133/87   Pulse 90   Temp 98.6 F (37 C)   Resp 18   Wt 174 lb 3.2 oz (79 kg)   SpO2 96%   BMI 26.49 kg/m   PHYSICAL EXAMINATION: General appearance: alert, cooperative and very hard of hearing  Head: Normocephalic, without obvious abnormality, atraumatic Neck: no adenopathy, no carotid bruit, no JVD, supple, symmetrical, trachea midline and thyroid not enlarged, symmetric, no tenderness/mass/nodules Lymph nodes: Cervical, supraclavicular, and axillary nodes normal. Resp: diminished breath sounds RML and RUL Back: symmetric, no curvature. ROM normal. No CVA tenderness. Cardio:  regular rate and rhythm, S1, S2 normal, no murmur, click, rub or gallop GI: soft, non-tender; bowel sounds normal; no masses,  no organomegaly Extremities: extremities normal, atraumatic, no cyanosis or edema and Homans sign is negative, no sign of DVT Neurologic: Grossly normal  Diagnostic Studies & Laboratory data:     Recent Radiology Findings:   Ct Chest W Contrast  Result Date: 01/29/2016 CLINICAL DATA:  Right upper lobe opacity on chest x-ray. EXAM: CT CHEST WITH CONTRAST TECHNIQUE: Multidetector CT imaging of the chest was performed during intravenous contrast administration. CONTRAST:  75 mL Isovue-300 IV. COMPARISON:  Chest x-ray 01/23/2016 and abdominal CT 12/20/2011 FINDINGS: Mediastinum/Lymph Nodes: Mild mediastinal and right hilar adenopathy. 1.1 cm precarinal and right paratracheal lymph nodes as well as 1.2 cm right hilar lymph node. Mild ectasia of the ascending thoracic aorta measuring 3.3 cm in AP diameter. Remaining mediastinal structures are within normal. 9 mm hypodense nodule over the right lobe of the thyroid. Lungs/Pleura: Lungs are adequately inflated demonstrate a heterogeneous solid mass over the posterior right upper lobe abutting the major fissure measuring 4.3 x 6.3 cm in AP and transverse dimension. Focus of calcification along its posterior aspect. This mass extends to the right suprahilar region. There is a small right pleural effusion. Mild atelectasis along the right major and minor fissure. 8 mm nodule over the periphery of the lateral left lower lobe. 5 mm nodule over the posterior left lower lobe. Calcified granuloma over the lateral right lower lobe. Upper abdomen: Multiple bilateral renal stones are present. No hydronephrosis. Remainder of the visualized upper abdomen is unremarkable. Musculoskeletal: Mild degenerate change of the spine. No focal lytic or sclerotic lesions. IMPRESSION: Heterogeneous solid mass over the posterior right upper lobe measuring 4.3 x 6.3  cm extending to the right hilar region. Associated mediastinal and right hilar adenopathy. Small right pleural effusion. This likely represents primary bronchogenic carcinoma. Two left lobe nodules measuring 5 and 8 mm which may be due to metastatic disease. Recommend tissue sampling. Bilateral nephrolithiasis. 9 mm right thyroid nodule. Electronically Signed   By: Marin Olp M.D.   On: 01/29/2016 09:11  Nm Pet Image Initial (pi) Skull Base To Thigh  Result Date: 02/06/2016 CLINICAL DATA:  Initial treatment strategy for right lung mass. EXAM: NUCLEAR MEDICINE PET SKULL BASE TO THIGH TECHNIQUE: 8.69 mCi F-18 FDG was injected intravenously. Full-ring PET imaging was performed from the skull base to thigh after the radiotracer. CT data was obtained and used for attenuation correction and anatomic localization. FASTING BLOOD GLUCOSE:  Value: 114 mg/dl COMPARISON:  CT chest dated 01/29/2016. CT abdomen pelvis dated 12/20/2011. FINDINGS: NECK Right posterior cervical lymph nodes measuring up to 7 mm short axis (series 4/ image 25), max SUV 9.7, suspicious. Mild pharyngeal hypermetabolism, likely reactive. 1.4 cm right thyroid nodule (series 4/ image 46), max SUV 12.3. CHEST 4.7 x 6.4 cm posterior right upper  lobe mass (series 7/image 22), max SUV 63.6, compatible with primary bronchogenic neoplasm. Thoracic lymphadenopathy, including: --Right paratracheal lymphadenopathy measuring up to 12 mm short axis (series 4/ image 63), max SUV 15.3 --13 mm short axis right hilar node (series 4/ image 68), max SUV 24.8 Abnormal pleural-based soft tissue in the right hemithorax. For example, 4.3 x 0.9 cm lesion medially (series 4/ image 63), max SUV 33.8. Additional abnormal soft tissue with hypermetabolism along the right anterior hemidiaphragm (series 4/image 101), max SUV 24.5. Associated moderate right pleural effusion. ABDOMEN/PELVIS No abnormal hypermetabolic activity within the liver, pancreas, adrenal glands, or spleen.  Numerous bilateral nonobstructing renal calculi, measuring up to 9 mm in the right lower pole (series 4/image 124). No hydronephrosis. No hypermetabolic lymph nodes in the abdomen or pelvis. SKELETON Osseous metastases, including: --Left inferior scapula, max SUV 16.9 --Left anterior 7th rib, max SUV 43.5 IMPRESSION: 4.7 x 6.4 cm posterior right upper lobe mass, compatible primary bronchogenic neoplasm. Associated right hilar, mediastinal, and right posterior cervical nodal metastases. Pleural-based metastases in the right hemithorax, as above. Moderate right pleural effusion. Left scapular and 7th rib osseous metastases, as above. Hypermetabolic 1.4 cm right thyroid nodule. Hypermetabolic thyroid nodules demonstrate approximately 30-40% likelihood of thyroid malignancy. Metastasis is also possible. Consider thyroid ultrasound with sampling if clinically warranted in this patient. Electronically Signed   By: Julian Hy M.D.   On: 02/06/2016 09:21     I have independently reviewed the above radiologic studies.  Recent Lab Findings: Lab Results  Component Value Date   WBC 12.4 (H) 02/06/2016   HGB 14.2 02/06/2016   HCT 41.0 02/06/2016   PLT 318 02/06/2016   GLUCOSE 107 02/06/2016   ALT 35 02/06/2016   AST 28 02/06/2016   NA 138 02/06/2016   K 4.0 02/06/2016   CL 104 12/20/2011   CREATININE 1.2 02/06/2016   BUN 12.8 02/06/2016   CO2 24 02/06/2016      Assessment / Plan:    4.7 x 6.4 cm posterior right upper lobe mass, compatible primary bronchogenic neoplasm. Associated right hilar, mediastinal, and right posterior cervical nodal metastases.  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 MRI the brain is planned [facility] plan our directed ultrasound-guided thoracentesis  of the right chest, to drain dry and preserve as much fluid for cell block and genetic testing as possible. If this does not result in adequate tissue or a diagnosis, I discussed with patient proceeding with bronchoscopy ebus possible mediastinoscopy.  I  spent 40 minutes counseling the patient face to face and 50% or more the  time was spent in counseling and coordination of care. The total time spent in the appointment was 60 minutes.  Grace Isaac MD      [address] [place] [address] Office [phone]   Beeper [phone]  02/06/2016 5:47 PM

## 2016-02-06 NOTE — Progress Notes (Signed)
[name] [phone]   [phone] Multidisciplinary thoracic oncology clinic  CONSULT NOTE  REFERRING PHYSICIAN: Dr. Lajean Manes  REASON FOR CONSULTATION:  75 years old white male with questionable lung cancer.  HPI Wesley Coleman is a 75 y.o. male a never smoker with past medical history significant for history of prostate cancer status post radiotherapy in 2003, dyslipidemia, history of kidney stone and glaucoma. The patient has been complaining of cough as well as pain on the right side of the chest a few weeks ago. He was seen by his primary care physician and chest x-ray was performed on 01/27/2016 and it showed right lung opacity suspicious for pneumonia. This was followed by CT scan of the chest with contrast on 01/29/2016 and it showed Heterogeneous solid mass over the posterior right upper lobe measuring 4.3 x 6.3 cm extending to the right hilar region. Associated mediastinal and right hilar adenopathy. Small right pleural effusion. This likely represents primary bronchogenic carcinoma. Two left lobe nodules measuring 5 and 8 mm which may be due to metastatic disease. A PET scan was performed on 02/06/2016 and it showed 4.7 x 6.4 cm posterior right upper lobe mass, compatible primary bronchogenic neoplasm. Associated right hilar, mediastinal, and right posterior cervical nodal metastases.  Pleural-based metastases in the right hemithorax, as above. Moderate right pleural effusion. Left scapular and 7th rib osseous metastases. Hypermetabolic 1.4 cm right thyroid nodule. Dr. Felipa Eth kindly referred the patient to the multidisciplinary thoracic oncology clinic today for further evaluation and recommendation regarding this new abnormalities. When seen today the patient is very anxious. He continues to have pain on the right side of the chest as well as dry cough but no significant shortness of breath or hemoptysis. He denied having any  significant weight loss or night sweats. He continues to have occasional headache and constipation. He has no nausea, vomiting or diarrhea. Family history significant for father with lung cancer and mother had colon cancer. The patient is married and has 2 children. He was accompanied by his wife Diane. He used to work for [employer] in [place]. He has no history of smoking. He drinks alcohol occasionally and no history of drug abuse.  HPI  Past Medical History:  Diagnosis Date  . Glaucoma   . Kidney stones     Past Surgical History:  Procedure Laterality Date  . none      Family History  Problem Relation Age of Onset  . Diabetes Paternal Grandmother   . Colon cancer Mother   . Heart Problems Maternal Grandmother     Social History Social History  Substance Use Topics  . Smoking status: Never Smoker  . Smokeless tobacco: Never Used  . Alcohol use Yes     Comment: one beer once in a while    Allergies  Allergen Reactions  . Dye Fdc Red [Red Dye]     Had reaction to dye treatment from kidney stones  . Erythromycin Nausea And Vomiting  . Penicillins Hives    Current Outpatient Prescriptions  Medication Sig Dispense Refill  . aspirin EC 81 MG tablet Take 1 tablet (81 mg total) by mouth daily. (Patient taking differently: Take 81 mg by mouth as needed. ) 90 tablet 3  . clonazePAM (KLONOPIN) 2 MG tablet Take 1 mg by mouth at bedtime as needed. For sleep    . Dorzolamide HCl-Timolol Mal PF 22.3-6.8 MG/ML SOLN Apply 22.3 mg to eye 2 (  two) times daily. One drop in both eyes.    . pravastatin (PRAVACHOL) 20 MG tablet TAKE ONE TABLET BY MOUTH EVERY OTHER DAY 45 tablet 0  . sildenafil (REVATIO) 20 MG tablet Take 20 mg by mouth as needed.     No current facility-administered medications for this visit.     Review of Systems  Constitutional: positive for fatigue Eyes: negative Ears, nose, mouth, throat, and face: negative Respiratory:  positive for cough and pleurisy/chest pain Cardiovascular: negative Gastrointestinal: negative Genitourinary:negative Integument/breast: negative Hematologic/lymphatic: negative Musculoskeletal:negative Neurological: negative Behavioral/Psych: negative Endocrine: negative Allergic/Immunologic: negative  Physical Exam  ZDG:LOVFI, healthy, no distress, well nourished, well developed and anxious SKIN: skin color, texture, turgor are normal, no rashes or significant lesions HEAD: Normocephalic, No masses, lesions, tenderness or abnormalities EYES: normal, PERRLA, Conjunctiva are pink and non-injected EARS: External ears normal, Canals clear OROPHARYNX:no exudate, no erythema and lips, buccal mucosa, and tongue normal  NECK: supple, no adenopathy, no JVD LYMPH:  no palpable lymphadenopathy, no hepatosplenomegaly LUNGS: clear to auscultation , and palpation HEART: regular rate & rhythm, no murmurs and no gallops ABDOMEN:abdomen soft, non-tender, normal bowel sounds and no masses or organomegaly BACK: Back symmetric, no curvature., No CVA tenderness EXTREMITIES:no joint deformities, effusion, or inflammation, no edema, no skin discoloration  NEURO: alert & oriented x 3 with fluent speech, no focal motor/sensory deficits  PERFORMANCE STATUS: ECOG 1  LABORATORY DATA: Lab Results  Component Value Date   WBC 12.4 (H) 02/06/2016   HGB 14.2 02/06/2016   HCT 41.0 02/06/2016   MCV 88.6 02/06/2016   PLT 318 02/06/2016      Chemistry      Component Value Date/Time   NA 138 02/06/2016 1435   K 4.0 02/06/2016 1435   CL 104 12/20/2011 1025   CO2 24 02/06/2016 1435   BUN 12.8 02/06/2016 1435   CREATININE 1.2 02/06/2016 1435      Component Value Date/Time   CALCIUM 9.5 02/06/2016 1435   ALKPHOS 159 (H) 02/06/2016 1435   AST 28 02/06/2016 1435   ALT 35 02/06/2016 1435   BILITOT 0.67 02/06/2016 1435       RADIOGRAPHIC STUDIES: Ct Chest W Contrast  Result Date:  01/29/2016 CLINICAL DATA:  Right upper lobe opacity on chest x-ray. EXAM: CT CHEST WITH CONTRAST TECHNIQUE: Multidetector CT imaging of the chest was performed during intravenous contrast administration. CONTRAST:  75 mL Isovue-300 IV. COMPARISON:  Chest x-ray 01/23/2016 and abdominal CT 12/20/2011 FINDINGS: Mediastinum/Lymph Nodes: Mild mediastinal and right hilar adenopathy. 1.1 cm precarinal and right paratracheal lymph nodes as well as 1.2 cm right hilar lymph node. Mild ectasia of the ascending thoracic aorta measuring 3.3 cm in AP diameter. Remaining mediastinal structures are within normal. 9 mm hypodense nodule over the right lobe of the thyroid. Lungs/Pleura: Lungs are adequately inflated demonstrate a heterogeneous solid mass over the posterior right upper lobe abutting the major fissure measuring 4.3 x 6.3 cm in AP and transverse dimension. Focus of calcification along its posterior aspect. This mass extends to the right suprahilar region. There is a small right pleural effusion. Mild atelectasis along the right major and minor fissure. 8 mm nodule over the periphery of the lateral left lower lobe. 5 mm nodule over the posterior left lower lobe. Calcified granuloma over the lateral right lower lobe. Upper abdomen: Multiple bilateral renal stones are present. No hydronephrosis. Remainder of the visualized upper abdomen is unremarkable. Musculoskeletal: Mild degenerate change of the spine. No focal lytic or sclerotic lesions.  IMPRESSION: Heterogeneous solid mass over the posterior right upper lobe measuring 4.3 x 6.3 cm extending to the right hilar region. Associated mediastinal and right hilar adenopathy. Small right pleural effusion. This likely represents primary bronchogenic carcinoma. Two left lobe nodules measuring 5 and 8 mm which may be due to metastatic disease. Recommend tissue sampling. Bilateral nephrolithiasis. 9 mm right thyroid nodule. Electronically Signed   By: Marin Olp M.D.   On:  01/29/2016 09:11  Nm Pet Image Initial (pi) Skull Base To Thigh  Result Date: 02/06/2016 CLINICAL DATA:  Initial treatment strategy for right lung mass. EXAM: NUCLEAR MEDICINE PET SKULL BASE TO THIGH TECHNIQUE: 8.69 mCi F-18 FDG was injected intravenously. Full-ring PET imaging was performed from the skull base to thigh after the radiotracer. CT data was obtained and used for attenuation correction and anatomic localization. FASTING BLOOD GLUCOSE:  Value: 114 mg/dl COMPARISON:  CT chest dated 01/29/2016. CT abdomen pelvis dated 12/20/2011. FINDINGS: NECK Right posterior cervical lymph nodes measuring up to 7 mm short axis (series 4/ image 25), max SUV 9.7, suspicious. Mild pharyngeal hypermetabolism, likely reactive. 1.4 cm right thyroid nodule (series 4/ image 46), max SUV 12.3. CHEST 4.7 x 6.4 cm posterior right upper lobe mass (series 7/image 22), max SUV 63.6, compatible with primary bronchogenic neoplasm. Thoracic lymphadenopathy, including: --Right paratracheal lymphadenopathy measuring up to 12 mm short axis (series 4/ image 63), max SUV 15.3 --13 mm short axis right hilar node (series 4/ image 68), max SUV 24.8 Abnormal pleural-based soft tissue in the right hemithorax. For example, 4.3 x 0.9 cm lesion medially (series 4/ image 63), max SUV 33.8. Additional abnormal soft tissue with hypermetabolism along the right anterior hemidiaphragm (series 4/image 101), max SUV 24.5. Associated moderate right pleural effusion. ABDOMEN/PELVIS No abnormal hypermetabolic activity within the liver, pancreas, adrenal glands, or spleen. Numerous bilateral nonobstructing renal calculi, measuring up to 9 mm in the right lower pole (series 4/image 124). No hydronephrosis. No hypermetabolic lymph nodes in the abdomen or pelvis. SKELETON Osseous metastases, including: --Left inferior scapula, max SUV 16.9 --Left anterior 7th rib, max SUV 43.5 IMPRESSION: 4.7 x 6.4 cm posterior right upper lobe mass, compatible primary  bronchogenic neoplasm. Associated right hilar, mediastinal, and right posterior cervical nodal metastases. Pleural-based metastases in the right hemithorax, as above. Moderate right pleural effusion. Left scapular and 7th rib osseous metastases, as above. Hypermetabolic 1.4 cm right thyroid nodule. Hypermetabolic thyroid nodules demonstrate approximately 30-40% likelihood of thyroid malignancy. Metastasis is also possible. Consider thyroid ultrasound with sampling if clinically warranted in this patient. Electronically Signed   By: Julian Hy M.D.   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   PLAN: I had a lengthy discussion with the patient and his wife today about his current disease stage, prognosis and treatment options. I will arrange for the patient to see Dr. Servando Snare later today for consideration of tissue diagnosis either with ultrasound-guided thoracentesis of the enlarging right pleural effusion or bronchoscopy with endobronchial ultrasound and biopsies. I will also complete the staging workup by ordering a MRI of the brain to rule out brain metastasis. If the final pathology is consistent with adenocarcinoma, I will request the tissue to be sent for molecular studies as well as PDL 1 expression. I will arrange for the patient to come  back for follow-up visit in 2 week for reevaluation and more detailed discussion of his treatment options based on the final pathology. The patient was seen during the multidisciplinary thoracic oncology clinic today by medical oncology, thoracic surgery, thoracic navigator, social worker and physical therapist. The patient was advised to call immediately if he has any concerning symptoms in the interval. The patient voices understanding of current  disease status and treatment options and is in agreement with the current care plan.  All questions were answered. The patient knows to call the clinic with any problems, questions or concerns. We can certainly see the patient much sooner if necessary.  Thank you so much for allowing me to participate in the care of Wesley Coleman. I will continue to follow up the patient with you and assist in his care.  I spent 40 minutes counseling the patient face to face. The total time spent in the appointment was 60 minutes.  Disclaimer: This note was dictated with voice recognition software. Similar sounding words can inadvertently be transcribed and may not be corrected upon review.   Jayelle Page K. February 06, 2016, 3:44 PM

## 2016-02-07 ENCOUNTER — Telehealth: Payer: Self-pay | Admitting: Internal Medicine

## 2016-02-07 NOTE — Telephone Encounter (Signed)
spoke w/ pt confirmed 8/17 apt times

## 2016-02-08 ENCOUNTER — Ambulatory Visit (HOSPITAL_COMMUNITY)
Admission: RE | Admit: 2016-02-08 | Discharge: 2016-02-08 | Disposition: A | Discharge status: Home / Self Care | Payer: Medicare Other | Source: Ambulatory Visit | Attending: Internal Medicine | Admitting: Internal Medicine

## 2016-02-08 DIAGNOSIS — R918 Other nonspecific abnormal finding of lung field: Secondary | ICD-10-CM | POA: Diagnosis present

## 2016-02-08 DIAGNOSIS — I6782 Cerebral ischemia: Secondary | ICD-10-CM | POA: Insufficient documentation

## 2016-02-08 MED ORDER — GADOBENATE DIMEGLUMINE 529 MG/ML IV SOLN
16.0000 mL | Freq: Once | INTRAVENOUS | Status: AC | PRN
  Administered 2016-02-08: 16 mL via INTRAVENOUS

## 2016-02-13 ENCOUNTER — Telehealth: Payer: Self-pay | Admitting: Medical Oncology

## 2016-02-13 NOTE — Telephone Encounter (Signed)
Since coming in to office , "Pt is worse, not eating , depending on his hydrocodone, giving up. He did get out to the store this am , but at home he gets a little more out of it,  he is taking 1 tablet every 6 hours-the prescription says take 1 -2 tablets as needed. His pain is sharp and on his right side." I asked her to read the label instructions on the pain med and she said "take 1-2 tablets as needed". She  also asked for brain MRI results.Per Wesley Coleman . I told her that Wesley Coleman can take his hydrocodone 1-2 tablets every 6 hours prn pain and that his brain MRI was negative for cancer. She sounded relieved and appreciated the news.

## 2016-02-20 ENCOUNTER — Telehealth: Payer: Self-pay | Admitting: *Deleted

## 2016-02-20 ENCOUNTER — Ambulatory Visit (HOSPITAL_COMMUNITY)
Admission: RE | Admit: 2016-02-20 | Discharge: 2016-02-20 | Disposition: A | Discharge status: Home / Self Care | Payer: Medicare Other | Source: Ambulatory Visit | Attending: Internal Medicine | Admitting: Internal Medicine

## 2016-02-20 ENCOUNTER — Other Ambulatory Visit: Payer: Self-pay | Admitting: Internal Medicine

## 2016-02-20 ENCOUNTER — Ambulatory Visit (HOSPITAL_COMMUNITY)
Admission: RE | Admit: 2016-02-20 | Discharge: 2016-02-20 | Disposition: A | Discharge status: Home / Self Care | Payer: Medicare Other | Source: Ambulatory Visit | Attending: Radiology | Admitting: Radiology

## 2016-02-20 ENCOUNTER — Other Ambulatory Visit (HOSPITAL_BASED_OUTPATIENT_CLINIC_OR_DEPARTMENT_OTHER): Payer: Medicare Other

## 2016-02-20 ENCOUNTER — Ambulatory Visit (HOSPITAL_BASED_OUTPATIENT_CLINIC_OR_DEPARTMENT_OTHER): Payer: Medicare Other | Admitting: Internal Medicine

## 2016-02-20 ENCOUNTER — Encounter: Payer: Self-pay | Admitting: *Deleted

## 2016-02-20 ENCOUNTER — Encounter: Payer: Self-pay | Admitting: Internal Medicine

## 2016-02-20 VITALS — BP 108/67 | HR 109 | Temp 97.7°F | Resp 19 | Ht 68.0 in | Wt 171.4 lb

## 2016-02-20 DIAGNOSIS — R918 Other nonspecific abnormal finding of lung field: Secondary | ICD-10-CM | POA: Diagnosis not present

## 2016-02-20 DIAGNOSIS — J90 Pleural effusion, not elsewhere classified: Secondary | ICD-10-CM

## 2016-02-20 DIAGNOSIS — Z9889 Other specified postprocedural states: Secondary | ICD-10-CM

## 2016-02-20 DIAGNOSIS — C3491 Malignant neoplasm of unspecified part of right bronchus or lung: Secondary | ICD-10-CM

## 2016-02-20 DIAGNOSIS — J942 Hemothorax: Secondary | ICD-10-CM | POA: Insufficient documentation

## 2016-02-20 HISTORY — DX: Malignant neoplasm of unspecified part of right bronchus or lung: C34.91

## 2016-02-20 HISTORY — PX: IR GENERIC HISTORICAL: IMG1180011

## 2016-02-20 LAB — CBC WITH DIFFERENTIAL/PLATELET
BASO%: 0.6 % (ref 0.0–2.0)
Basophils Absolute: 0.1 10*3/uL (ref 0.0–0.1)
EOS%: 11.8 % — ABNORMAL HIGH (ref 0.0–7.0)
Eosinophils Absolute: 2.8 10*3/uL — ABNORMAL HIGH (ref 0.0–0.5)
HEMATOCRIT: 37.8 % — AB (ref 38.4–49.9)
HEMOGLOBIN: 12.6 g/dL — AB (ref 13.0–17.1)
LYMPH#: 0.9 10*3/uL (ref 0.9–3.3)
LYMPH%: 4 % — ABNORMAL LOW (ref 14.0–49.0)
MCH: 29.6 pg (ref 27.2–33.4)
MCHC: 33.2 g/dL (ref 32.0–36.0)
MCV: 89.2 fL (ref 79.3–98.0)
MONO#: 2.4 10*3/uL — AB (ref 0.1–0.9)
MONO%: 10.5 % (ref 0.0–14.0)
NEUT#: 17 10*3/uL — ABNORMAL HIGH (ref 1.5–6.5)
NEUT%: 73.1 % (ref 39.0–75.0)
PLATELETS: 385 10*3/uL (ref 140–400)
RBC: 4.24 10*6/uL (ref 4.20–5.82)
RDW: 12.8 % (ref 11.0–14.6)
WBC: 23.3 10*3/uL — AB (ref 4.0–10.3)

## 2016-02-20 LAB — COMPREHENSIVE METABOLIC PANEL
ALBUMIN: 2.1 g/dL — AB (ref 3.5–5.0)
ALK PHOS: 269 U/L — AB (ref 40–150)
ALT: 55 U/L (ref 0–55)
AST: 24 U/L (ref 5–34)
Anion Gap: 10 mEq/L (ref 3–11)
BUN: 14.3 mg/dL (ref 7.0–26.0)
CALCIUM: 9.4 mg/dL (ref 8.4–10.4)
CO2: 25 mEq/L (ref 22–29)
Chloride: 102 mEq/L (ref 98–109)
Creatinine: 1.1 mg/dL (ref 0.7–1.3)
EGFR: 63 mL/min/{1.73_m2} — AB (ref >90)
Glucose: 125 mg/dl (ref 70–140)
POTASSIUM: 4.6 meq/L (ref 3.5–5.1)
Sodium: 137 mEq/L (ref 136–145)
Total Bilirubin: 1 mg/dL (ref 0.20–1.20)
Total Protein: 7.3 g/dL (ref 6.4–8.3)

## 2016-02-20 MED ORDER — LIDOCAINE HCL 1 % IJ SOLN
INTRAMUSCULAR | Status: DC | PRN
  Administered 2016-02-20: 5 mL via INTRADERMAL

## 2016-02-20 NOTE — Telephone Encounter (Signed)
Returned call to patient, discussed Mondays appt with me first the nurse , getting his weight , vitals, medications, assessment, , and MD consult afterwards,   Radiation,   Treatments, stated he had a dry cough, and that they pulled a big bottle of fluid off today ,asked if he was breathing better after the thoracentesis?, Yes he stated and the coughing isn't as bad, I stated that his appt is for Monday at 830, to go to the [facility] and go to the first cubicle where [place] would check him in and give him a pager and then he would proceed to the elevator and come to the ground floor and go to the waiting room for radiation, and I would come and get him, he thanked me for returning his call  4:34 PM

## 2016-02-20 NOTE — Procedures (Signed)
Ultrasound-guided diagnostic and therapeutic right  thoracentesis performed yielding 1.6 liters of dark, bloody fluid. No immediate complications. Follow-up chest x-ray pending. The fluid was sent to the lab for preordered studies.

## 2016-02-20 NOTE — Progress Notes (Signed)
Oncology Nurse Navigator Documentation  Oncology Nurse Navigator Flowsheets 02/20/2016  Navigator Encounter Type Clinic/MDC  Treatment Phase Pre-Tx/Tx Discussion  Barriers/Navigation Needs Coordination of Care/per Dr. Julien Nordmann he requested I figure out why patient has not had thoracentesis yet.  I noted order was place by Dr. Servando Snare on 02/06/16.  I called radiology and was told to call central scheduling. I called.  Apparently, the order never went to central scheduling's work q.  I was given an appt time for patient to have thoracentesis on Monday 8/21.  Dr. Julien Nordmann was updated.  He called Radiology dept and spoke with PA.  They are able to work patient in for thoracentesis today at 10:00.  I showed patient and wife where to go.    Interventions Coordination of Care  Coordination of Care Radiology  Acuity Level 2  Time Spent with Patient 30

## 2016-02-20 NOTE — Progress Notes (Signed)
[facility] [phone]   Fax:([phone]) [phone]  OFFICE PROGRESS NOTE  Mathews Argyle, MD [address] [place] [address]  DIAGNOSIS: Stage IV (T2b, N3, M1b) non-small cell lung cancer likely adenocarcinoma in a patient with never smoking history  PRIOR THERAPY:None.  CURRENT THERAPY: None  INTERVAL HISTORY: Wesley Coleman 75 y.o. male returns to the clinic today for follow-up visit accompanied by his wife. The patient was last seen 2 weeks ago. He was also seen by Dr. Servando Snare at that time and recommendation was to proceed with ultrasound-guided thoracentesis for diagnosis and also to relieve his dyspnea. For some reason unfortunately the procedure was not performed. The patient is here today for evaluation. MRI of the brain showed no evidence for metastatic disease to the brain. He continues to have generalized weakness and fatigue as well as shortness of breath at baseline and increased with exertion. He was able to drive to the office today. He denied having any fever or chills. He has no nausea or vomiting.  MEDICAL HISTORY: Past Medical History:  Diagnosis Date  . Glaucoma   . Kidney stones     ALLERGIES:  is allergic to dye fdc red [red dye]; erythromycin; and penicillins.  MEDICATIONS:  Current Outpatient Prescriptions  Medication Sig Dispense Refill  . aspirin EC 81 MG tablet Take 1 tablet (81 mg total) by mouth daily. (Patient taking differently: Take 81 mg by mouth as needed. ) 90 tablet 3  . clonazePAM (KLONOPIN) 2 MG tablet Take 1 mg by mouth at bedtime as needed. For sleep    . Dorzolamide HCl-Timolol Mal PF 22.3-6.8 MG/ML SOLN Apply 22.3 mg to eye 2 (two) times daily. One drop in both eyes.    . pravastatin (PRAVACHOL) 20 MG tablet TAKE ONE TABLET BY MOUTH EVERY OTHER DAY 45 tablet 0  . sildenafil (REVATIO) 20 MG tablet Take 20 mg by mouth as needed.     No current facility-administered medications for this  visit.     SURGICAL HISTORY:  Past Surgical History:  Procedure Laterality Date  . none      REVIEW OF SYSTEMS:  Constitutional: positive for fatigue Eyes: negative Ears, nose, mouth, throat, and face: negative Respiratory: positive for cough, dyspnea on exertion and wheezing Cardiovascular: negative Gastrointestinal: negative Genitourinary:negative Integument/breast: negative Hematologic/lymphatic: negative Musculoskeletal:positive for muscle weakness Neurological: negative Behavioral/Psych: negative Endocrine: negative Allergic/Immunologic: negative   PHYSICAL EXAMINATION: General appearance: alert, cooperative, fatigued and no distress Head: Normocephalic, without obvious abnormality, atraumatic Neck: no adenopathy, no JVD, supple, symmetrical, trachea midline and thyroid not enlarged, symmetric, no tenderness/mass/nodules Lymph nodes: Cervical, supraclavicular, and axillary nodes normal. Resp: diminished breath sounds RLL and dullness to percussion RLL Back: symmetric, no curvature. ROM normal. No CVA tenderness. Cardio: regular rate and rhythm, S1, S2 normal, no murmur, click, rub or gallop GI: soft, non-tender; bowel sounds normal; no masses,  no organomegaly Extremities: extremities normal, atraumatic, no cyanosis or edema Neurologic: Alert and oriented X 3, normal strength and tone. Normal symmetric reflexes. Normal coordination and gait  ECOG PERFORMANCE STATUS: 1 - Symptomatic but completely ambulatory  There were no vitals taken for this visit.  LABORATORY DATA: Lab Results  Component Value Date   WBC 23.3 (H) 02/20/2016   HGB 12.6 (L) 02/20/2016   HCT 37.8 (L) 02/20/2016   MCV 89.2 02/20/2016   PLT 385 02/20/2016      Chemistry      Component Value Date/Time  NA 138 02/06/2016 1435   K 4.0 02/06/2016 1435   CL 104 12/20/2011 1025   CO2 24 02/06/2016 1435   BUN 12.8 02/06/2016 1435   CREATININE 1.2 02/06/2016 1435      Component Value Date/Time     CALCIUM 9.5 02/06/2016 1435   ALKPHOS 159 (H) 02/06/2016 1435   AST 28 02/06/2016 1435   ALT 35 02/06/2016 1435   BILITOT 0.67 02/06/2016 1435       RADIOGRAPHIC STUDIES: Ct Chest W Contrast  Result Date: 01/29/2016 CLINICAL DATA:  Right upper lobe opacity on chest x-ray. EXAM: CT CHEST WITH CONTRAST TECHNIQUE: Multidetector CT imaging of the chest was performed during intravenous contrast administration. CONTRAST:  75 mL Isovue-300 IV. COMPARISON:  Chest x-ray 01/23/2016 and abdominal CT 12/20/2011 FINDINGS: Mediastinum/Lymph Nodes: Mild mediastinal and right hilar adenopathy. 1.1 cm precarinal and right paratracheal lymph nodes as well as 1.2 cm right hilar lymph node. Mild ectasia of the ascending thoracic aorta measuring 3.3 cm in AP diameter. Remaining mediastinal structures are within normal. 9 mm hypodense nodule over the right lobe of the thyroid. Lungs/Pleura: Lungs are adequately inflated demonstrate a heterogeneous solid mass over the posterior right upper lobe abutting the major fissure measuring 4.3 x 6.3 cm in AP and transverse dimension. Focus of calcification along its posterior aspect. This mass extends to the right suprahilar region. There is a small right pleural effusion. Mild atelectasis along the right major and minor fissure. 8 mm nodule over the periphery of the lateral left lower lobe. 5 mm nodule over the posterior left lower lobe. Calcified granuloma over the lateral right lower lobe. Upper abdomen: Multiple bilateral renal stones are present. No hydronephrosis. Remainder of the visualized upper abdomen is unremarkable. Musculoskeletal: Mild degenerate change of the spine. No focal lytic or sclerotic lesions. IMPRESSION: Heterogeneous solid mass over the posterior right upper lobe measuring 4.3 x 6.3 cm extending to the right hilar region. Associated mediastinal and right hilar adenopathy. Small right pleural effusion. This likely represents primary bronchogenic carcinoma.  Two left lobe nodules measuring 5 and 8 mm which may be due to metastatic disease. Recommend tissue sampling. Bilateral nephrolithiasis. 9 mm right thyroid nodule. Electronically Signed   By: Marin Olp M.D.   On: 01/29/2016 09:11  Mr Jeri Cos ZO Contrast  Result Date: 02/08/2016 CLINICAL DATA:  Right upper lobe lung cancer. Initial staging. Hearing loss. History of prostate cancer. EXAM: MRI HEAD WITHOUT AND WITH CONTRAST TECHNIQUE: Multiplanar, multiecho pulse sequences of the brain and surrounding structures were obtained without and with intravenous contrast. CONTRAST:  39m MULTIHANCE GADOBENATE DIMEGLUMINE 529 MG/ML IV SOLN COMPARISON:  None. FINDINGS: There is no evidence of acute infarct, intracranial hemorrhage, mass, midline shift, or extra-axial fluid collection. Mild cerebral atrophy is within normal limits for age. Patchy T2 hyperintensities are present throughout the cerebral white matter, confluent in the periatrial regions and nonspecific but compatible with extensive chronic small vessel ischemic disease. 4.3 x 1.6 cm extra-axial CSF space in the middle cranial fossa is consistent with an incidental arachnoid cyst resulting in mild flattening of the temporal pole. No abnormal enhancement is identified. No suspicious skull lesions are seen. Orbits are unremarkable. A 5 mm cystic focus at the inferomedial aspect of the right external auditory canal is benign in appearance. No significant inflammatory disease is seen in the paranasal sinuses are mastoid air cells. Major intracranial vascular flow voids are preserved. IMPRESSION: 1. No evidence of intracranial metastases. 2. Extensive chronic small vessel ischemic disease.  Electronically Signed   By: Logan Bores M.D.   On: 02/08/2016 09:11   Nm Pet Image Initial (pi) Skull Base To Thigh  Result Date: 02/06/2016 CLINICAL DATA:  Initial treatment strategy for right lung mass. EXAM: NUCLEAR MEDICINE PET SKULL BASE TO THIGH TECHNIQUE: 8.69 mCi F-18  FDG was injected intravenously. Full-ring PET imaging was performed from the skull base to thigh after the radiotracer. CT data was obtained and used for attenuation correction and anatomic localization. FASTING BLOOD GLUCOSE:  Value: 114 mg/dl COMPARISON:  CT chest dated 01/29/2016. CT abdomen pelvis dated 12/20/2011. FINDINGS: NECK Right posterior cervical lymph nodes measuring up to 7 mm short axis (series 4/ image 25), max SUV 9.7, suspicious. Mild pharyngeal hypermetabolism, likely reactive. 1.4 cm right thyroid nodule (series 4/ image 46), max SUV 12.3. CHEST 4.7 x 6.4 cm posterior right upper lobe mass (series 7/image 22), max SUV 63.6, compatible with primary bronchogenic neoplasm. Thoracic lymphadenopathy, including: --Right paratracheal lymphadenopathy measuring up to 12 mm short axis (series 4/ image 63), max SUV 15.3 --13 mm short axis right hilar node (series 4/ image 68), max SUV 24.8 Abnormal pleural-based soft tissue in the right hemithorax. For example, 4.3 x 0.9 cm lesion medially (series 4/ image 63), max SUV 33.8. Additional abnormal soft tissue with hypermetabolism along the right anterior hemidiaphragm (series 4/image 101), max SUV 24.5. Associated moderate right pleural effusion. ABDOMEN/PELVIS No abnormal hypermetabolic activity within the liver, pancreas, adrenal glands, or spleen. Numerous bilateral nonobstructing renal calculi, measuring up to 9 mm in the right lower pole (series 4/image 124). No hydronephrosis. No hypermetabolic lymph nodes in the abdomen or pelvis. SKELETON Osseous metastases, including: --Left inferior scapula, max SUV 16.9 --Left anterior 7th rib, max SUV 43.5 IMPRESSION: 4.7 x 6.4 cm posterior right upper lobe mass, compatible primary bronchogenic neoplasm. Associated right hilar, mediastinal, and right posterior cervical nodal metastases. Pleural-based metastases in the right hemithorax, as above. Moderate right pleural effusion. Left scapular and 7th rib osseous  metastases, as above. Hypermetabolic 1.4 cm right thyroid nodule. Hypermetabolic thyroid nodules demonstrate approximately 30-40% likelihood of thyroid malignancy. Metastasis is also possible. Consider thyroid ultrasound with sampling if clinically warranted in this patient. Electronically Signed   By: Julian Hy M.D.   On: 02/06/2016 09:21    ASSESSMENT AND PLAN: This is a very pleasant 75 years old white male with highly suspicious for stage IV non-small cell lung cancer probably adenocarcinoma in a patient with never smoking history. Unfortunately the tissue diagnosis is a still pending. I discussed with the patient his wife proceeding with ultrasound-guided right thoracentesis for diagnostic and therapeutic purposes today. I ordered the test on urgent basis. This is insufficient material, we will send a tissue block for molecular studies. I will also refer the patient to radiation oncology for consideration of short course of palliative radiotherapy to the obstructing the right lung lesion. I will arrange for the patient to come back for follow-up visit in one week for evaluation and discussion of his treatment options based on the final pathology. He was advised to call immediately if he has any concerning symptoms in the interval. The patient voices understanding of current disease status and treatment options and is in agreement with the current care plan.  All questions were answered. The patient knows to call the clinic with any problems, questions or concerns. We can certainly see the patient much sooner if necessary.  I spent 15 minutes counseling the patient face to face. The total time spent  in the appointment was 25 minutes.  Disclaimer: This note was dictated with voice recognition software. Similar sounding words can inadvertently be transcribed and may not be corrected upon review.

## 2016-02-20 NOTE — Telephone Encounter (Signed)
Oncology Nurse Navigator Documentation  Oncology Nurse Navigator Flowsheets 02/20/2016  Navigator Encounter Type Telephone  Telephone Outgoing Call/I called patient to see how he was feeling from his thoracentesis today.  I was unable to reach.  I left a vm message stating I was checking on him and about his appt to be seen with Rad Onc on 02/24/16 at 8:00.  I also left my name and phone number to call if needed.   Treatment Phase Pre-Tx/Tx Discussion  Barriers/Navigation Needs Coordination of Care  Interventions Coordination of Care  Coordination of Care Appts  Acuity Level 1  Acuity Level 1 Minimal follow up required  Time Spent with Patient 30

## 2016-02-20 NOTE — Telephone Encounter (Signed)
Oncology Nurse Navigator Documentation  Oncology Nurse Navigator Flowsheets 02/20/2016  Navigator Encounter Type Telephone  Telephone Incoming Call/Mr. Vice called.  I asked how he was feeling after thoracentesis.  He stated he felt better.  I updated him on upcoming appt with Dr. Lisbeth Renshaw, he stated he was aware.  Per Dr. Julien Nordmann, he asked that patient have a follow up appt with him on 02/28/16 at 12:00 noon.  I will update scheduling team to call patient with appt.   Treatment Phase Pre-Tx/Tx Discussion  Barriers/Navigation Needs Education  Education Other  Interventions Education Method  Education Method Verbal  Acuity Level 1  Time Spent with Patient 30

## 2016-02-21 NOTE — Progress Notes (Signed)
Thoracic Location of Tumor / Histology: Right upper  Lung Lobe mass(associated right hilar,mediastinal,and right posterior cervical nodal metastases   With Osseous metastases,left inferior scapula,left anterior 7th rib )  Patient presented  months ago with symptoms of: increasing cough,shortness breath,congestion  Biopsies of  (if applicable) revealed: Cytology pending from Thoracentesis 02/20/16  Tobacco/Marijuana/Snuff/ETOH use : never smoker no smokeless tobacco, occasional beer  ,no drug use  Past/Anticipated interventions by cardiothoracic surgery, if any: Thoracentesis 02/20/16:Dr. Madelyn Flavors   Past/Anticipated interventions by medical oncology, if any: Dr. Rosalene Billings seen 02/20/16 referral for palliative radiation  To right lung  Signs/Symptoms  Weight changes, if any: none  Respiratory complaints, if any: Shortness breath,cough   Hemoptysis, if any: no  Pain issues, if any: yes, burning chest, difficult swallowing at times 4/10  scale  SAFETY ISSUES: generalized weakness,and fatigue  Prior radiation? Yes, Prostate  2003 Dr. Danny Lawless, MD  Pacemaker/ICD? NO   Is the patient on methotrexate? NO  Current Complaints / other details:  Married,  Mother Colon Cancer,  Allergies:PCNS=Hives, Erythromycin=N&V,Dye Fdc =reaction to dye treatment from kidney stones BP 98/79 (BP Location: Left Arm, Patient Position: Sitting, Cuff Size: Normal)   Pulse (!) 121   Temp 99.2 F (37.3 C) (Oral)   Resp 20   Ht '5\' 8"'$  (1.727 m)   Wt 171 lb 4.8 oz (77.7 kg)   SpO2 100% Comment: room air  BMI 26.05 kg/m   Wt Readings from Last 3 Encounters:  02/24/16 171 lb 4.8 oz (77.7 kg)  02/20/16 171 lb 6.4 oz (77.7 kg)  02/06/16 174 lb 3.2 oz (79 kg)

## 2016-02-23 NOTE — Progress Notes (Signed)
Radiation Oncology         (336) [phone] ________________________________  Name: MICAIAH LITLE MRN: [id number]  Date: 02/24/2016  DOB: 12-15-1940  AS:[id number],ALP Marcello Moores, MD  Curt Bears, MD     REFERRING PHYSICIAN: Curt Bears, MD   DIAGNOSIS: The primary encounter diagnosis was Malignant neoplasm of upper lobe of right lung [facility]). Diagnoses of Lung mass, Pleural effusion, right, Cancer of overlapping sites of right lung ([place]), Shortness of breath, Chest wall pain, Hypotension, unspecified hypotension type, and Situational depression were also pertinent to this visit.    HISTORY OF PRESENT ILLNESS:Leif F Curto is a 75 y.o. male who is seen for an initial consultation at the request of Dr. Julien Nordmann. He was seen in July by his PCP for a cough and right sided chest pain. A CXR on 01/27/16 revealed a right lung opacity. A Chest CT 01/29/2016 showed heterogenous solid mass over the posterior right upper lobe measuring 4.3 x 6.3 cm extending to the right hilar region. Associated mediastinal and right hilar adenopathy. Small right pleural effusion. This likely represents primary bronchogenic carcinoma. Two left lobe nodules measuring 5 and 8 mm which may be due to metastatic disease. Bilateral nephrolithiasis. 9 mm right thyroid nodule. A PET 02/06/2016 revealed 4.7 x 6.4 cm posterior right upper lobe mass, compatible primary bronchogenic neoplasm with associated right hilar, mediastinal and right posterior cervical nodal metastases. Pleural-based metastases in the right hemithorax and moderate right pleural effusion. Left scapular and 7th rib osseous metastases noted. Hypermetabolic 1.4 cm right thyroid nodule. Brain MRI on 02/08/2016 showed no evidence of intracranial metastases. Extensive chronic small vessel ischemic disease noted.   He underwent a thoracentesis on 02/20/16 and had 1.6 liters withdrawn and this confirms NSCLC, favor adenocarcinoma. He comes today to discuss options for  palliative radiotherapy to the right chest with Dr. Lisbeth Renshaw.  PREVIOUS RADIATION THERAPY: Yes, radiotherapy to the prostate in 2003 with Dr. Danny Lawless   PAST MEDICAL HISTORY:  Past Medical History:  Diagnosis Date  . Cancer of right lung ([place]) 02/20/2016  . Glaucoma   . Kidney stones     PAST SURGICAL HISTORY: Past Surgical History:  Procedure Laterality Date  . IR GENERIC HISTORICAL  02/20/2016   IR THORACENTESIS ASP PLEURAL SPACE W/IMG GUIDE 02/20/2016 [facility]-INTERV RAD  . none      FAMILY HISTORY:  Family History  Problem Relation Age of Onset  . Diabetes Paternal Grandmother   . Colon cancer Mother   . Heart Problems Maternal Grandmother     SOCIAL HISTORY:  reports that he has never smoked. He has never used smokeless tobacco. He reports that he drinks alcohol. He reports that he does not use drugs. The patient is married and lives in [place]. He is retired from working for the city of [place]: [place] red [red dye]; Erythromycin; and Penicillins   MEDICATIONS:  Current Outpatient Prescriptions  Medication Sig Dispense Refill  . calcium carbonate (TUMS - DOSED IN MG ELEMENTAL CALCIUM) 500 MG chewable tablet Chew 1 tablet by mouth daily as needed for indigestion or heartburn.    . clonazePAM (KLONOPIN) 1 MG tablet Take 1 mg by mouth at bedtime.     . sildenafil (REVATIO) 20 MG tablet Take 20 mg by mouth as needed.    Marland Kitchen aspirin EC 81 MG tablet Take 1 tablet (81 mg total) by mouth daily. (Patient not taking: Reported on 02/24/2016) 90 tablet 3  . Dorzolamide HCl-Timolol Mal PF 22.3-6.8 MG/ML SOLN Apply 22.3  mg to eye 2 (two) times daily. One drop in both eyes.    . pravastatin (PRAVACHOL) 20 MG tablet TAKE ONE TABLET BY MOUTH EVERY OTHER DAY (Patient not taking: Reported on 02/24/2016) 45 tablet 0   No current facility-administered medications for this encounter.      REVIEW OF SYSTEMS:  On review of systems, the patient reports that he is struggling  overall. He and his wife describe depression regarding this new diagnosis and the shock about this. He has poor po intake and attributes this to lack of appetite. He has chest pain in the right chest wall along the lateral part of the chest and states that he has shortness of breath with exertion. He continues to have a cough but denies any hemoptysis. He has felt warm today and has a temp of 99.2. He denies any  chills, night sweats, unintended weight changes. He denies any bowel or bladder disturbances, and denies abdominal pain, nausea or vomiting. He denies any new musculoskeletal or joint aches or pains. A complete review of systems is obtained and is otherwise negative.   PHYSICAL EXAM:  height is '5\' 8"'$  (1.727 m) and weight is 171 lb 4.8 oz (77.7 kg). His oral temperature is 99.2 F (37.3 C). His blood pressure is 98/79 and his pulse is 121 (abnormal). His respiration is 20 and oxygen saturation is 100%.   In general this is a chronically ill and tired appearing caucasian male in no acute distress. He is alert and oriented x4 and appropriate throughout the examination. HEENT reveals that the patient is normocephalic, atraumatic. EOMs are intact. PERRLA. Skin is intact without any evidence of gross lesions. Cardiovascular exam reveals a tachycardic rate and rhythm, no clicks rubs or murmurs are auscultated. Chest is clear to auscultation on the left chest, and decreased breath sounds are noted from the scapula down to the base. Lymphatic assessment is performed and reveals adenopathy in the right deep cervical chain but none in the supraclavicular, axillary, or inguinal chains. Abdomen has active bowel sounds in all quadrants and is intact. The abdomen is soft, non tender, non distended. Lower extremities are negative for pretibial pitting edema, deep calf tenderness, cyanosis or clubbing.  ECOG = 2  0 - Asymptomatic (Fully active, able to carry on all predisease activities without restriction)  1 -  Symptomatic but completely ambulatory (Restricted in physically strenuous activity but ambulatory and able to carry out work of a light or sedentary nature. For example, light housework, office work)  2 - Symptomatic, <50% in bed during the day (Ambulatory and capable of all self care but unable to carry out any work activities. Up and about more than 50% of waking hours)  3 - Symptomatic, >50% in bed, but not bedbound (Capable of only limited self-care, confined to bed or chair 50% or more of waking hours)  4 - Bedbound (Completely disabled. Cannot carry on any self-care. Totally confined to bed or chair)  5 - Death   Eustace Pen MM, Creech RH, Tormey DC, et al. [phone]). "Toxicity and response criteria of the [facility] Group". [place] Oncol. 5 (6): 649-55     LABORATORY DATA:  Lab Results  Component Value Date   WBC 23.3 (H) 02/20/2016   HGB 12.6 (L) 02/20/2016   HCT 37.8 (L) 02/20/2016   MCV 89.2 02/20/2016   PLT 385 02/20/2016   Lab Results  Component Value Date   NA 137 02/20/2016   K 4.6 02/20/2016  CL 104 12/20/2011   CO2 25 02/20/2016   Lab Results  Component Value Date   ALT 55 02/20/2016   AST 24 02/20/2016   ALKPHOS 269 (H) 02/20/2016   BILITOT 1.00 02/20/2016      RADIOGRAPHY: Dg Chest 1 View  Result Date: 02/20/2016 CLINICAL DATA:  History of lung carcinoma, rib right thoracentesis EXAM: CHEST 1 VIEW COMPARISON:  Chest x-ray of 01/23/2016, and CT chest of 01/29/2016 FINDINGS: After right thoracentesis, some the right pleural effusion has been evacuated. The masslike opacity noted by chest x-ray in the right infrahilar region is not as well seen, being partially obscured by fluid and volume loss. Probable loculated fluid is noted creating haziness in the right upper hemi thorax. No pneumothorax is seen. The left lung is clear. IMPRESSION: 1. No pneumothorax after right thoracentesis. 2. Right pleural effusion. The right infrahilar mass is not well  seen. Electronically Signed   By: Ivar Drape M.D.   On: 02/20/2016 10:43   Ct Chest W Contrast  Result Date: 01/29/2016 CLINICAL DATA:  Right upper lobe opacity on chest x-ray. EXAM: CT CHEST WITH CONTRAST TECHNIQUE: Multidetector CT imaging of the chest was performed during intravenous contrast administration. CONTRAST:  75 mL Isovue-300 IV. COMPARISON:  Chest x-ray 01/23/2016 and abdominal CT 12/20/2011 FINDINGS: Mediastinum/Lymph Nodes: Mild mediastinal and right hilar adenopathy. 1.1 cm precarinal and right paratracheal lymph nodes as well as 1.2 cm right hilar lymph node. Mild ectasia of the ascending thoracic aorta measuring 3.3 cm in AP diameter. Remaining mediastinal structures are within normal. 9 mm hypodense nodule over the right lobe of the thyroid. Lungs/Pleura: Lungs are adequately inflated demonstrate a heterogeneous solid mass over the posterior right upper lobe abutting the major fissure measuring 4.3 x 6.3 cm in AP and transverse dimension. Focus of calcification along its posterior aspect. This mass extends to the right suprahilar region. There is a small right pleural effusion. Mild atelectasis along the right major and minor fissure. 8 mm nodule over the periphery of the lateral left lower lobe. 5 mm nodule over the posterior left lower lobe. Calcified granuloma over the lateral right lower lobe. Upper abdomen: Multiple bilateral renal stones are present. No hydronephrosis. Remainder of the visualized upper abdomen is unremarkable. Musculoskeletal: Mild degenerate change of the spine. No focal lytic or sclerotic lesions. IMPRESSION: Heterogeneous solid mass over the posterior right upper lobe measuring 4.3 x 6.3 cm extending to the right hilar region. Associated mediastinal and right hilar adenopathy. Small right pleural effusion. This likely represents primary bronchogenic carcinoma. Two left lobe nodules measuring 5 and 8 mm which may be due to metastatic disease. Recommend tissue  sampling. Bilateral nephrolithiasis. 9 mm right thyroid nodule. Electronically Signed   By: Marin Olp M.D.   On: 01/29/2016 09:11  Mr Jeri Cos XN Contrast  Result Date: 02/08/2016 CLINICAL DATA:  Right upper lobe lung cancer. Initial staging. Hearing loss. History of prostate cancer. EXAM: MRI HEAD WITHOUT AND WITH CONTRAST TECHNIQUE: Multiplanar, multiecho pulse sequences of the brain and surrounding structures were obtained without and with intravenous contrast. CONTRAST:  45m MULTIHANCE GADOBENATE DIMEGLUMINE 529 MG/ML IV SOLN COMPARISON:  None. FINDINGS: There is no evidence of acute infarct, intracranial hemorrhage, mass, midline shift, or extra-axial fluid collection. Mild cerebral atrophy is within normal limits for age. Patchy T2 hyperintensities are present throughout the cerebral white matter, confluent in the periatrial regions and nonspecific but compatible with extensive chronic small vessel ischemic disease. 4.3 x 1.6 cm extra-axial CSF space  in the middle cranial fossa is consistent with an incidental arachnoid cyst resulting in mild flattening of the temporal pole. No abnormal enhancement is identified. No suspicious skull lesions are seen. Orbits are unremarkable. A 5 mm cystic focus at the inferomedial aspect of the right external auditory canal is benign in appearance. No significant inflammatory disease is seen in the paranasal sinuses are mastoid air cells. Major intracranial vascular flow voids are preserved. IMPRESSION: 1. No evidence of intracranial metastases. 2. Extensive chronic small vessel ischemic disease. Electronically Signed   By: Logan Bores M.D.   On: 02/08/2016 09:11   Nm Pet Image Initial (pi) Skull Base To Thigh  Result Date: 02/06/2016 CLINICAL DATA:  Initial treatment strategy for right lung mass. EXAM: NUCLEAR MEDICINE PET SKULL BASE TO THIGH TECHNIQUE: 8.69 mCi F-18 FDG was injected intravenously. Full-ring PET imaging was performed from the skull base to thigh  after the radiotracer. CT data was obtained and used for attenuation correction and anatomic localization. FASTING BLOOD GLUCOSE:  Value: 114 mg/dl COMPARISON:  CT chest dated 01/29/2016. CT abdomen pelvis dated 12/20/2011. FINDINGS: NECK Right posterior cervical lymph nodes measuring up to 7 mm short axis (series 4/ image 25), max SUV 9.7, suspicious. Mild pharyngeal hypermetabolism, likely reactive. 1.4 cm right thyroid nodule (series 4/ image 46), max SUV 12.3. CHEST 4.7 x 6.4 cm posterior right upper lobe mass (series 7/image 22), max SUV 63.6, compatible with primary bronchogenic neoplasm. Thoracic lymphadenopathy, including: --Right paratracheal lymphadenopathy measuring up to 12 mm short axis (series 4/ image 63), max SUV 15.3 --13 mm short axis right hilar node (series 4/ image 68), max SUV 24.8 Abnormal pleural-based soft tissue in the right hemithorax. For example, 4.3 x 0.9 cm lesion medially (series 4/ image 63), max SUV 33.8. Additional abnormal soft tissue with hypermetabolism along the right anterior hemidiaphragm (series 4/image 101), max SUV 24.5. Associated moderate right pleural effusion. ABDOMEN/PELVIS No abnormal hypermetabolic activity within the liver, pancreas, adrenal glands, or spleen. Numerous bilateral nonobstructing renal calculi, measuring up to 9 mm in the right lower pole (series 4/image 124). No hydronephrosis. No hypermetabolic lymph nodes in the abdomen or pelvis. SKELETON Osseous metastases, including: --Left inferior scapula, max SUV 16.9 --Left anterior 7th rib, max SUV 43.5 IMPRESSION: 4.7 x 6.4 cm posterior right upper lobe mass, compatible primary bronchogenic neoplasm. Associated right hilar, mediastinal, and right posterior cervical nodal metastases. Pleural-based metastases in the right hemithorax, as above. Moderate right pleural effusion. Left scapular and 7th rib osseous metastases, as above. Hypermetabolic 1.4 cm right thyroid nodule. Hypermetabolic thyroid nodules  demonstrate approximately 30-40% likelihood of thyroid malignancy. Metastasis is also possible. Consider thyroid ultrasound with sampling if clinically warranted in this patient. Electronically Signed   By: Julian Hy M.D.   On: 02/06/2016 09:21   Ir Thoracentesis Asp Pleural Space W/img Guide  Result Date: 02/20/2016 INDICATION: Patient with history of right lung mass, right pleural effusion. Request made for diagnostic and therapeutic right thoracentesis. EXAM: ULTRASOUND GUIDED DIAGNOSTIC AND THERAPEUTIC RIGHT THORACENTESIS MEDICATIONS: None. COMPLICATIONS: None immediate. PROCEDURE: An ultrasound guided thoracentesis was thoroughly discussed with the patient and questions answered. The benefits, risks, alternatives and complications were also discussed. The patient understands and wishes to proceed with the procedure. Written consent was obtained. Ultrasound was performed to localize and mark an adequate pocket of fluid in the right chest. The area was then prepped and draped in the normal sterile fashion. 1% Lidocaine was used for local anesthesia. Under ultrasound guidance a Safe-T-Centesis catheter  was introduced. Thoracentesis was performed. The catheter was removed and a dressing applied. FINDINGS: A total of approximately 1.6 liters of dark, bloody fluid was removed. Samples were sent to the laboratory as requested by the clinical team. IMPRESSION: Successful ultrasound guided diagnostic and therapeutic right thoracentesis yielding 1.6 liters of pleural fluid. Read by: Rowe Robert, PA-C Electronically Signed   By: Jerilynn Mages.  Shick M.D.   On: 02/20/2016 12:59       IMPRESSION/PLAN: 1. Stage IV, T2b, N3, M1b NSCLC, favor adenocarcinoma of the right lung. Dr. Lisbeth Renshaw discusses the findings of the patient's imaging studies as well as his cytology from thoracentesis which confirms the diagnosis. He reviews the rationale for palliative radiotherapy to help improve his breathing and for local control of  his tumor. He discusses the role of 10 fractions of radiation over 2 weeks. We reviewed the risks, benefits, short, and long term effects of therapy and the patient is interested in moving forward. Written consent was obtained, and he will be scheduled for simulation today.  Dr. Julien Nordmann will meet with the patient later this week to discuss systemic therapy. 2. Chest wall pain. We have given the patient hydrocodone 5/325 #1 po once in the department. We will continue to follow up with him and provide extra refills as needed.  3. Hypotension. The patient does have hypotension and this is likely multifactorial given poor po intake, tumor location, recent fluid shifts due to thoracentesis and pain medications. We will follow this closely during the course of treatment. 4. Situational depression. We will refer to social work for counseling as well. We will continue to follow this expectantly as well.  5. Elevated temperature. He does not have any symptoms of infectious cause. This could be due to the cytokines from his tumor. We will follow this expectantly and reassess this during the course of treatment.  The above documentation reflects my direct findings during this shared patient visit. Please see the separate note by Dr. Lisbeth Renshaw on this date for the remainder of the patient's plan of care.     Carola Rhine, PAC

## 2016-02-24 ENCOUNTER — Ambulatory Visit (HOSPITAL_COMMUNITY): Payer: Medicare Other

## 2016-02-24 ENCOUNTER — Ambulatory Visit
Admission: RE | Admit: 2016-02-24 | Discharge: 2016-02-24 | Disposition: A | Discharge status: Home / Self Care | Payer: Medicare Other | Source: Ambulatory Visit | Attending: Radiation Oncology | Admitting: Radiation Oncology

## 2016-02-24 ENCOUNTER — Encounter: Payer: Self-pay | Admitting: Radiation Oncology

## 2016-02-24 VITALS — BP 98/79 | HR 121 | Temp 99.2°F | Resp 20 | Ht 68.0 in | Wt 171.3 lb

## 2016-02-24 DIAGNOSIS — R0789 Other chest pain: Secondary | ICD-10-CM

## 2016-02-24 DIAGNOSIS — C3411 Malignant neoplasm of upper lobe, right bronchus or lung: Secondary | ICD-10-CM

## 2016-02-24 DIAGNOSIS — I959 Hypotension, unspecified: Secondary | ICD-10-CM

## 2016-02-24 DIAGNOSIS — C3481 Malignant neoplasm of overlapping sites of right bronchus and lung: Secondary | ICD-10-CM

## 2016-02-24 DIAGNOSIS — R918 Other nonspecific abnormal finding of lung field: Secondary | ICD-10-CM

## 2016-02-24 DIAGNOSIS — Z510 Encounter for antineoplastic radiation therapy: Secondary | ICD-10-CM | POA: Insufficient documentation

## 2016-02-24 DIAGNOSIS — F4321 Adjustment disorder with depressed mood: Secondary | ICD-10-CM

## 2016-02-24 DIAGNOSIS — J90 Pleural effusion, not elsewhere classified: Secondary | ICD-10-CM

## 2016-02-24 DIAGNOSIS — R0602 Shortness of breath: Secondary | ICD-10-CM

## 2016-02-24 DIAGNOSIS — Z880 Allergy status to penicillin: Secondary | ICD-10-CM | POA: Insufficient documentation

## 2016-02-24 HISTORY — DX: Malignant neoplasm of unspecified part of right bronchus or lung: C34.91

## 2016-02-24 MED ORDER — HYDROCODONE-ACETAMINOPHEN 5-325 MG PO TABS
1.0000 | ORAL_TABLET | Freq: Once | ORAL | Status: AC
  Administered 2016-02-24: 1 via ORAL
  Filled 2016-02-24: qty 1

## 2016-02-24 NOTE — Addendum Note (Signed)
Encounter addended by: Doreen Beam, RN on: 02/24/2016  9:54 AM<BR>    Actions taken: Patient Education assessment filed

## 2016-02-24 NOTE — Progress Notes (Signed)
Please see the Nurse Progress Note in the MD Initial Consult Encounter for this patient. 

## 2016-02-24 NOTE — Progress Notes (Signed)
Gave 1 tablet hydrocodone/apap 5/'325mg'$  oral x 1 time now per  Shona Simpson, PA, here to have a CT simulation of chest at 1000 am, patient name and dob as identification, pain was a 4/10 scale burning chest 8:56 AM

## 2016-02-24 NOTE — Progress Notes (Signed)
Reassessed pain "it is a little better stated patient "

## 2016-02-24 NOTE — Progress Notes (Signed)
  Radiation Oncology         [phone] ________________________________  Name: Wesley Coleman MRN: [id number]  Date: 02/24/2016  DOB: 27-Jul-1940  SIMULATION AND TREATMENT PLANNING NOTE  DIAGNOSIS:     ICD-9-CM ICD-10-CM   1. Malignant neoplasm of upper lobe of right lung ([facility]) 162.3 C34.11      NARRATIVE:  The patient was brought to the [facility].  Identity was confirmed.  All relevant records and images related to the planned course of therapy were reviewed.   Written consent to proceed with treatment was confirmed which was freely given after reviewing the details related to the planned course of therapy had been reviewed with the patient.  Then, the patient was set-up in a stable reproducible supine position for radiation therapy.  The patient's arms were raised above using a wing board device. CT images were obtained.  An isocenter was placed within the chest in relation to the target volume. Surface markings were placed.    The CT images were loaded into the planning software.  Then the target and avoidance structures were contoured.  Treatment planning then occurred.  The radiation prescription was entered and confirmed.  A total of 2 complex treatment devices were fabricated which correspond to the designed customized radiation treatment fields. Each of these customized fields/ complex treatment devices will be used on a daily basis during the radiation course. I have requested a 3D Simulation.  I have requested a DVH of the following structures: target volume, spinal cord, lungs, esophagus.   The patient will undergo daily image guidance to ensure accurate localization of the target, and adequate minimize dose to the normal surrounding structures in close proximity to the target.   PLAN:  The patient will initially receive 30 Gy in 10 fractions.    ________________________________

## 2016-02-25 ENCOUNTER — Telehealth: Payer: Self-pay | Admitting: Internal Medicine

## 2016-02-25 ENCOUNTER — Other Ambulatory Visit: Payer: Self-pay | Admitting: Radiology

## 2016-02-25 ENCOUNTER — Telehealth: Payer: Self-pay | Admitting: Radiation Oncology

## 2016-02-25 DIAGNOSIS — J90 Pleural effusion, not elsewhere classified: Secondary | ICD-10-CM

## 2016-02-25 DIAGNOSIS — C3411 Malignant neoplasm of upper lobe, right bronchus or lung: Secondary | ICD-10-CM

## 2016-02-25 NOTE — Telephone Encounter (Signed)
I spoke with the patient to review the options for pleurex catheter. Dr. Lisbeth Renshaw and I feel this would be beneficial and have contacted the lung navigator. Dr. Servando Snare is out of the office this week, and the decision has been deferred back to [place]. I contacted the patient this morning as above and his wife states he has more shallow breathing, and has been more fatigued and tired since yesterday's visit. I offered pleurex placement with IR, and also offered our symptom management clinic to evaluate his BP and consider hydration based on labs. The patient is interested more so in having IR place a pleurex. We will coordinate this asap. Orders were placed and scheduling was notified.

## 2016-02-25 NOTE — Patient Instructions (Signed)
NPO after midnight, have driver, denies blood thinners, arrive at [facility] Radiology at 0930

## 2016-02-25 NOTE — Telephone Encounter (Signed)
Called pt to conf appt. L/M. Appt ltr and schd mailed. 02/25/16

## 2016-02-26 ENCOUNTER — Ambulatory Visit (HOSPITAL_COMMUNITY)
Admission: RE | Admit: 2016-02-26 | Discharge: 2016-02-26 | Disposition: A | Discharge status: Home / Self Care | Payer: Medicare Other | Source: Ambulatory Visit | Attending: Radiation Oncology | Admitting: Radiation Oncology

## 2016-02-26 ENCOUNTER — Inpatient Hospital Stay (HOSPITAL_COMMUNITY)
Admission: EM | Admit: 2016-02-26 | Discharge: 2016-03-06 | DRG: 186 | Disposition: E | Payer: Medicare Other | Attending: Family Medicine | Admitting: Family Medicine

## 2016-02-26 ENCOUNTER — Encounter (HOSPITAL_COMMUNITY): Payer: Self-pay

## 2016-02-26 ENCOUNTER — Emergency Department (HOSPITAL_COMMUNITY): Payer: Medicare Other

## 2016-02-26 ENCOUNTER — Other Ambulatory Visit: Payer: Self-pay

## 2016-02-26 ENCOUNTER — Encounter: Payer: Self-pay | Admitting: *Deleted

## 2016-02-26 DIAGNOSIS — R0602 Shortness of breath: Secondary | ICD-10-CM | POA: Diagnosis present

## 2016-02-26 DIAGNOSIS — D638 Anemia in other chronic diseases classified elsewhere: Secondary | ICD-10-CM | POA: Diagnosis present

## 2016-02-26 DIAGNOSIS — R918 Other nonspecific abnormal finding of lung field: Secondary | ICD-10-CM | POA: Diagnosis present

## 2016-02-26 DIAGNOSIS — E860 Dehydration: Secondary | ICD-10-CM | POA: Diagnosis present

## 2016-02-26 DIAGNOSIS — Y95 Nosocomial condition: Secondary | ICD-10-CM | POA: Diagnosis present

## 2016-02-26 DIAGNOSIS — R0600 Dyspnea, unspecified: Secondary | ICD-10-CM | POA: Diagnosis not present

## 2016-02-26 DIAGNOSIS — D72829 Elevated white blood cell count, unspecified: Secondary | ICD-10-CM | POA: Diagnosis present

## 2016-02-26 DIAGNOSIS — J942 Hemothorax: Secondary | ICD-10-CM | POA: Diagnosis present

## 2016-02-26 DIAGNOSIS — N179 Acute kidney failure, unspecified: Secondary | ICD-10-CM | POA: Diagnosis present

## 2016-02-26 DIAGNOSIS — Z881 Allergy status to other antibiotic agents status: Secondary | ICD-10-CM | POA: Diagnosis not present

## 2016-02-26 DIAGNOSIS — C3411 Malignant neoplasm of upper lobe, right bronchus or lung: Secondary | ICD-10-CM | POA: Diagnosis present

## 2016-02-26 DIAGNOSIS — J9601 Acute respiratory failure with hypoxia: Secondary | ICD-10-CM | POA: Diagnosis present

## 2016-02-26 DIAGNOSIS — J90 Pleural effusion, not elsewhere classified: Secondary | ICD-10-CM

## 2016-02-26 DIAGNOSIS — J189 Pneumonia, unspecified organism: Secondary | ICD-10-CM | POA: Diagnosis present

## 2016-02-26 DIAGNOSIS — H9190 Unspecified hearing loss, unspecified ear: Secondary | ICD-10-CM | POA: Diagnosis present

## 2016-02-26 DIAGNOSIS — H409 Unspecified glaucoma: Secondary | ICD-10-CM | POA: Diagnosis present

## 2016-02-26 DIAGNOSIS — N289 Disorder of kidney and ureter, unspecified: Secondary | ICD-10-CM | POA: Diagnosis not present

## 2016-02-26 DIAGNOSIS — I959 Hypotension, unspecified: Secondary | ICD-10-CM | POA: Diagnosis not present

## 2016-02-26 DIAGNOSIS — C799 Secondary malignant neoplasm of unspecified site: Secondary | ICD-10-CM

## 2016-02-26 DIAGNOSIS — R0789 Other chest pain: Secondary | ICD-10-CM | POA: Diagnosis not present

## 2016-02-26 DIAGNOSIS — C7951 Secondary malignant neoplasm of bone: Secondary | ICD-10-CM | POA: Diagnosis present

## 2016-02-26 DIAGNOSIS — Z880 Allergy status to penicillin: Secondary | ICD-10-CM

## 2016-02-26 DIAGNOSIS — J9690 Respiratory failure, unspecified, unspecified whether with hypoxia or hypercapnia: Secondary | ICD-10-CM | POA: Diagnosis present

## 2016-02-26 DIAGNOSIS — I4891 Unspecified atrial fibrillation: Secondary | ICD-10-CM | POA: Diagnosis not present

## 2016-02-26 DIAGNOSIS — Z9102 Food additives allergy status: Secondary | ICD-10-CM

## 2016-02-26 DIAGNOSIS — E871 Hypo-osmolality and hyponatremia: Secondary | ICD-10-CM | POA: Diagnosis present

## 2016-02-26 DIAGNOSIS — Z87442 Personal history of urinary calculi: Secondary | ICD-10-CM | POA: Diagnosis not present

## 2016-02-26 DIAGNOSIS — Z515 Encounter for palliative care: Secondary | ICD-10-CM | POA: Diagnosis present

## 2016-02-26 DIAGNOSIS — J910 Malignant pleural effusion: Secondary | ICD-10-CM | POA: Diagnosis present

## 2016-02-26 DIAGNOSIS — Z66 Do not resuscitate: Secondary | ICD-10-CM | POA: Diagnosis not present

## 2016-02-26 DIAGNOSIS — Z833 Family history of diabetes mellitus: Secondary | ICD-10-CM | POA: Diagnosis not present

## 2016-02-26 DIAGNOSIS — Z800 Family history of malignant neoplasm of digestive organs: Secondary | ICD-10-CM

## 2016-02-26 DIAGNOSIS — E782 Mixed hyperlipidemia: Secondary | ICD-10-CM | POA: Diagnosis present

## 2016-02-26 DIAGNOSIS — Z79899 Other long term (current) drug therapy: Secondary | ICD-10-CM

## 2016-02-26 DIAGNOSIS — C801 Malignant (primary) neoplasm, unspecified: Secondary | ICD-10-CM | POA: Diagnosis not present

## 2016-02-26 LAB — BODY FLUID CELL COUNT WITH DIFFERENTIAL
EOS FL: 8 %
Lymphs, Fluid: 23 %
Monocyte-Macrophage-Serous Fluid: 12 % — ABNORMAL LOW (ref 50–90)
NEUTROPHIL FLUID: 57 % — AB (ref 0–25)
WBC FLUID: 8530 uL — AB (ref 0–1000)

## 2016-02-26 LAB — GRAM STAIN

## 2016-02-26 LAB — COMPREHENSIVE METABOLIC PANEL
ALBUMIN: 1.9 g/dL — AB (ref 3.5–5.0)
ALK PHOS: 208 U/L — AB (ref 38–126)
ALT: 39 U/L (ref 17–63)
AST: 42 U/L — AB (ref 15–41)
Anion gap: 13 (ref 5–15)
BILIRUBIN TOTAL: 2.3 mg/dL — AB (ref 0.3–1.2)
BUN: 30 mg/dL — AB (ref 6–20)
CO2: 22 mmol/L (ref 22–32)
Calcium: 8.8 mg/dL — ABNORMAL LOW (ref 8.9–10.3)
Chloride: 97 mmol/L — ABNORMAL LOW (ref 101–111)
Creatinine, Ser: 1.4 mg/dL — ABNORMAL HIGH (ref 0.61–1.24)
GFR calc Af Amer: 56 mL/min — ABNORMAL LOW (ref >60)
GFR calc non Af Amer: 48 mL/min — ABNORMAL LOW (ref >60)
GLUCOSE: 153 mg/dL — AB (ref 65–99)
POTASSIUM: 5.1 mmol/L (ref 3.5–5.1)
SODIUM: 132 mmol/L — AB (ref 135–145)
TOTAL PROTEIN: 7.2 g/dL (ref 6.5–8.1)

## 2016-02-26 LAB — URINE MICROSCOPIC-ADD ON: RBC / HPF: NONE SEEN RBC/hpf (ref 0–5)

## 2016-02-26 LAB — CBC WITH DIFFERENTIAL/PLATELET
BASOS PCT: 0 %
Basophils Absolute: 0 10*3/uL (ref 0.0–0.1)
EOS PCT: 3 %
Eosinophils Absolute: 1.4 10*3/uL — ABNORMAL HIGH (ref 0.0–0.7)
HCT: 29.7 % — ABNORMAL LOW (ref 39.0–52.0)
HEMOGLOBIN: 10.1 g/dL — AB (ref 13.0–17.0)
LYMPHS PCT: 3 %
Lymphs Abs: 1.4 10*3/uL (ref 0.7–4.0)
MCH: 29.6 pg (ref 26.0–34.0)
MCHC: 34 g/dL (ref 30.0–36.0)
MCV: 87.1 fL (ref 78.0–100.0)
MONOS PCT: 8 %
Monocytes Absolute: 3.7 10*3/uL — ABNORMAL HIGH (ref 0.1–1.0)
NEUTROS PCT: 86 %
Neutro Abs: 40.2 10*3/uL — ABNORMAL HIGH (ref 1.7–7.7)
Platelets: 481 10*3/uL — ABNORMAL HIGH (ref 150–400)
RBC: 3.41 MIL/uL — AB (ref 4.22–5.81)
RDW: 13.3 % (ref 11.5–15.5)
WBC: 46.7 10*3/uL — AB (ref 4.0–10.5)

## 2016-02-26 LAB — LACTIC ACID, PLASMA: Lactic Acid, Venous: 4 mmol/L (ref 0.5–1.9)

## 2016-02-26 LAB — PROTEIN, BODY FLUID: Total protein, fluid: 3.2 g/dL

## 2016-02-26 LAB — URINALYSIS, ROUTINE W REFLEX MICROSCOPIC
GLUCOSE, UA: NEGATIVE mg/dL
HGB URINE DIPSTICK: NEGATIVE
Ketones, ur: NEGATIVE mg/dL
Nitrite: POSITIVE — AB
PH: 5 (ref 5.0–8.0)
PROTEIN: NEGATIVE mg/dL
Specific Gravity, Urine: 1.025 (ref 1.005–1.030)

## 2016-02-26 LAB — I-STAT CG4 LACTIC ACID, ED: Lactic Acid, Venous: 4.05 mmol/L (ref 0.5–1.9)

## 2016-02-26 LAB — PROTIME-INR
INR: 1.21
Prothrombin Time: 15.4 seconds — ABNORMAL HIGH (ref 11.4–15.2)

## 2016-02-26 LAB — TROPONIN I: Troponin I: <0.03 ng/mL (ref <0.03)

## 2016-02-26 LAB — GLUCOSE, SEROUS FLUID

## 2016-02-26 LAB — HEMATOCRIT: HCT: 28.7 % — ABNORMAL LOW (ref 39.0–52.0)

## 2016-02-26 LAB — MRSA PCR SCREENING: MRSA by PCR: NEGATIVE

## 2016-02-26 MED ORDER — DEXTROSE 5 % IV SOLN
2.0000 g | Freq: Once | INTRAVENOUS | Status: AC
  Administered 2016-02-26: 2 g via INTRAVENOUS
  Filled 2016-02-26: qty 2

## 2016-02-26 MED ORDER — SODIUM CHLORIDE 0.9 % IV BOLUS (SEPSIS)
1000.0000 mL | Freq: Once | INTRAVENOUS | Status: AC
  Administered 2016-02-26: 1000 mL via INTRAVENOUS

## 2016-02-26 MED ORDER — SODIUM CHLORIDE 0.9 % IV BOLUS (SEPSIS)
500.0000 mL | Freq: Once | INTRAVENOUS | Status: AC
  Administered 2016-02-26: 500 mL via INTRAVENOUS

## 2016-02-26 MED ORDER — ONDANSETRON HCL 4 MG PO TABS: 4.0000 mg | ORAL_TABLET | Freq: Four times a day (QID) | ORAL | Status: DC | PRN

## 2016-02-26 MED ORDER — ONDANSETRON HCL 4 MG/2ML IJ SOLN
4.0000 mg | Freq: Four times a day (QID) | INTRAMUSCULAR | Status: DC | PRN
  Administered 2016-02-28: 4 mg via INTRAVENOUS
  Filled 2016-02-26: qty 2

## 2016-02-26 MED ORDER — VANCOMYCIN HCL IN DEXTROSE 1-5 GM/200ML-% IV SOLN
1000.0000 mg | Freq: Once | INTRAVENOUS | Status: AC
  Administered 2016-02-26: 1000 mg via INTRAVENOUS
  Filled 2016-02-26: qty 200

## 2016-02-26 MED ORDER — CLINDAMYCIN PHOSPHATE 600 MG/50ML IV SOLN
600.0000 mg | Freq: Once | INTRAVENOUS | Status: DC
  Filled 2016-02-26: qty 50

## 2016-02-26 MED ORDER — ACETAMINOPHEN 325 MG PO TABS
650.0000 mg | ORAL_TABLET | Freq: Four times a day (QID) | ORAL | Status: DC | PRN
  Administered 2016-02-27: 650 mg via ORAL
  Filled 2016-02-26: qty 2

## 2016-02-26 MED ORDER — ACETAMINOPHEN 650 MG RE SUPP: 650.0000 mg | Freq: Four times a day (QID) | RECTAL | Status: DC | PRN

## 2016-02-26 MED ORDER — SODIUM CHLORIDE 0.9 % IV BOLUS (SEPSIS)
1000.0000 mL | Freq: Once | INTRAVENOUS | Status: AC
Start: 2016-02-26 — End: 2016-02-26
  Administered 2016-02-26: 1000 mL via INTRAVENOUS

## 2016-02-26 MED ORDER — SODIUM CHLORIDE 0.9 % IV SOLN
INTRAVENOUS | Status: DC
  Administered 2016-02-26: 23:00:00 via INTRAVENOUS
  Administered 2016-02-26: 75 mL/h via INTRAVENOUS

## 2016-02-26 MED ORDER — IOPAMIDOL (ISOVUE-300) INJECTION 61%
75.0000 mL | Freq: Once | INTRAVENOUS | Status: AC | PRN
  Administered 2016-02-26: 75 mL via INTRAVENOUS

## 2016-02-26 MED ORDER — SODIUM CHLORIDE 0.9 % IV SOLN
INTRAVENOUS | Status: DC
  Administered 2016-02-26: 10:00:00 via INTRAVENOUS

## 2016-02-26 MED ORDER — VANCOMYCIN HCL IN DEXTROSE 750-5 MG/150ML-% IV SOLN
750.0000 mg | Freq: Two times a day (BID) | INTRAVENOUS | Status: DC
  Administered 2016-02-27 – 2016-02-29 (×5): 750 mg via INTRAVENOUS
  Filled 2016-02-26 (×6): qty 150

## 2016-02-26 MED ORDER — DEXTROSE 5 % IV SOLN
2.0000 g | Freq: Three times a day (TID) | INTRAVENOUS | Status: DC
  Administered 2016-02-26 – 2016-02-28 (×7): 2 g via INTRAVENOUS
  Filled 2016-02-26 (×9): qty 2

## 2016-02-26 MED ORDER — CLONAZEPAM 1 MG PO TABS
1.0000 mg | ORAL_TABLET | Freq: Every day | ORAL | Status: DC
  Administered 2016-02-26 – 2016-02-28 (×3): 1 mg via ORAL
  Filled 2016-02-26 (×3): qty 1

## 2016-02-26 NOTE — Progress Notes (Signed)
Spoke with RN Navigator (Lung) Norton Blizzard re: follow up support for wife with PleurX drain.  Hinton Dyer to follow up with Shona Simpson, PA in radiation oncology.

## 2016-02-26 NOTE — ED Notes (Signed)
Patient transported to X-ray 

## 2016-02-26 NOTE — Progress Notes (Signed)
Oncology Nurse Navigator Documentation  Oncology Nurse Navigator Flowsheets 02/05/2016  Navigator Encounter Type Other/I received a call from short stay.  She stated that Wesley Coleman does not look well.  He is very weak.  I updated Dr. Julien Nordmann.  I called short stay back and updated RN that Dr. Julien Nordmann thinks patient needs to be admitted with hospitalist or go to ED.  RN stated she would update [employer].  I will updated Rad Onc  Telephone Incoming Call  Treatment Phase Pre-Tx/Tx Discussion  Barriers/Navigation Needs Coordination of Care  Interventions Coordination of Care  Coordination of Care Other  Acuity Level 2  Acuity Level 2 Other  Time Spent with Patient 30

## 2016-02-26 NOTE — ED Triage Notes (Signed)
Patient arived from short stay. Patient was suppose to have a pleural drain placed, but had abnormal lab and weakness. Patient has a history of lung cancer and was suppose to start radiation tomorrow.

## 2016-02-26 NOTE — ED Notes (Signed)
Bed: WA06 Expected date:  Expected time:  Means of arrival:  Comments: Patient from short stay for possible admission

## 2016-02-26 NOTE — Progress Notes (Signed)
Patient ID: Wesley Coleman, male   DOB: 1941-07-02, 75 y.o.   MRN: [id number]    Referring Physician(s): Perkins,Alison Claire/Moody,J/Mohamed.M  Supervising Physician: Arne Cleveland  Patient Status:  Outpatient  Chief Complaint:  Dyspnea, chest pain , malignant right pleural effusion  Subjective: Patient familiar to IR service from prior right thoracentesis on 02/20/16 yielding 1.6 L of dark, bloody fluid. Cytology  revealed metastatic Interior carcinoma. He has a known hypermetabolic right upper lobe lung mass associated with right hilar/mediastinal/right posterior cervical adenopathy, pleural-based metastases in right hemithorax, left scapular and seventh rib osseous metastases as well as a hypermetabolic right thyroid nodule. He is scheduled to begin radiation therapy tomorrow and see Dr. Julien Nordmann on 8/25. Request has now been received for placement of a right Pleurx catheter for symptomatic malignant right pleural effusion. He currently denies fever, headache, abdominal pain, nausea, vomiting or abnormal bleeding. He has had weakness, anorexia, weight loss, chest discomfort, dyspnea, cough, and back pain. He is hard of hearing.    Allergies: Dye fdc red [red dye]; Erythromycin; and Penicillins  Medications: Prior to Admission medications   Medication Sig Start Date End Date Taking? Authorizing Provider  aspirin EC 81 MG tablet Take 1 tablet (81 mg total) by mouth daily. Patient not taking: Reported on 02/24/2016 01/09/14   Jettie Booze, MD  calcium carbonate (TUMS - DOSED IN MG ELEMENTAL CALCIUM) 500 MG chewable tablet Chew 1 tablet by mouth daily as needed for indigestion or heartburn.    Historical Provider, MD  clonazePAM (KLONOPIN) 1 MG tablet Take 1 mg by mouth at bedtime.  02/18/16   Historical Provider, MD  Dorzolamide HCl-Timolol Mal PF 22.3-6.8 MG/ML SOLN Apply 22.3 mg to eye 2 (two) times daily. One drop in both eyes.    Historical Provider, MD  HYDROcodone-acetaminophen  (NORCO/VICODIN) 5-325 MG tablet Take 1 tablet by mouth every 4 (four) hours as needed for moderate pain. 1/2 to 1 tablet every 4  or 6 hours prn pain    Historical Provider, MD  pravastatin (PRAVACHOL) 20 MG tablet TAKE ONE TABLET BY MOUTH EVERY OTHER DAY Patient not taking: Reported on 02/24/2016 12/31/15   Jettie Booze, MD  sildenafil (REVATIO) 20 MG tablet Take 20 mg by mouth as needed.    Historical Provider, MD     Vital Signs: BP 94/65 (BP Location: Right Arm)   Pulse (!) 123   Temp 98.3 F (36.8 C) (Oral)   Resp 20   SpO2 93%   Physical Exam patient appears weak, chest with diminished breath sounds right mid to lower lung fields, left clear. Heart with tachycardic but regular rhythm. Abdomen soft, positive bowel sounds, nontender. Lower extremities with no significant edema.  Imaging: No results found.  Labs:  CBC:  Recent Labs  02/06/16 1435 02/20/16 0839  WBC 12.4* 23.3*  HGB 14.2 12.6*  HCT 41.0 37.8*  PLT 318 385    COAGS: No results for input(s): INR, APTT in the last 8760 hours.  BMP:  Recent Labs  02/06/16 1435 02/20/16 0839  NA 138 137  K 4.0 4.6  CO2 24 25  GLUCOSE 107 125  BUN 12.8 14.3  CALCIUM 9.5 9.4  CREATININE 1.2 1.1    LIVER FUNCTION TESTS:  Recent Labs  02/06/16 1435 02/20/16 0839  BILITOT 0.67 1.00  AST 28 24  ALT 35 55  ALKPHOS 159* 269*  PROT 8.1 7.3  ALBUMIN 3.3* 2.1*    Assessment and Plan: Patient with newly diagnosed stage IV  metastatic non-small cell carcinoma and symptomatic malignant right pleural effusion; scheduled to begin palliative radiation therapy tomorrow and follow-up with oncology on 8/25. Request received for right Pleurx catheter placement for symptomatic malignant right pleural effusion. Was tentatively scheduled to start radiation therapy  tomorrow and consult with Dr. Julien Nordmann on 8/25. Details/risks of procedure, including but not limited to, internal bleeding, infection, inability to completely  drain effusion, d/w with patient and wife with their understanding and consent.  Pt appears much weaker today than during thoracentesis on 02/20/16 with chest pain, tachycardia, soft BP, WBC of 46.7, hgb 10.1, creat 1.4, albumin 1.9, t bili 2.3, troponin nl. Case d/w Dr. Worthy Flank nurse- plan to send pt to ED for further eval/possible [facility] admission/hold on pleurx cath placement for now.Plans d/w pt/wife.   Electronically Signed: D. Rowe Robert 02/28/2016, 10:05 AM   I spent a total of 25 minutes at the the patient's bedside AND on the patient's hospital floor or unit, greater than 50% of which was counseling/coordinating care for right pleurx catheter placement

## 2016-02-26 NOTE — ED Notes (Signed)
IR in the room with patient.

## 2016-02-26 NOTE — H&P (Addendum)
History and Physical    Wesley Coleman RUE:[id number] DOB: 10-16-1940 DOA: 02/08/2016  PCP: Mathews Argyle, MD  Outpatient Specialists: Oncology, Dr. Julien Nordmann, Radiation Oncology Dr Lisbeth Renshaw, [facility] Dr. Servando Snare Patient coming from: home  Chief Complaint: Shortness of breath  HPI: Wesley Coleman is a 75 y.o. male with medical history significant of Stage IV non-small cell carcinoma, recently diagnosed, seeing Dr. Earlie Server, supposed to start radiation therapy tomorrow on 8/24 followed by chemotherapy, presents to the emergency room with complaints of shortness of breath and weakness. Patient has had the pleural effusion which was tapped on 8/17, and it did show malignant cells. His initial diagnosis was in July 2017, when he had a CT of the chest which showed a heterogeneous solid mass of the posterior right upper lobe, 4.36.3 cm, with mediastinal and right hilar adenopathy, as well as 2 left lobe nodules measuring 5 and 8 millimeters concerning for metastatic disease. Patient complains of jaundice weakness and shortness of breath, had chest pain last night however none since, has no fever or chills, denies any abdominal pain, nausea, vomiting or diarrhea. His wife tells me that he has been coughing more recently and the cough has been productive  ED Course: in the ED vital signs are stable, his oxygen saturation is stable on 2 L nasal cannula, chest x-ray showed large pleural effusion, IR was consulted in the emergency room and performed thoracentesis which revealed bright red blood. His blood work was pertinent for elevated white count of 40 K. lactic acid was elevated to 4, has mild AK I with a creatinine of 1.4. TRH was asked for admission for recurrent malignant pleural effusion    Review of Systems: As per HPI otherwise 10 point review of systems negative.   Past Medical History:  Diagnosis Date  . Cancer of right lung ([place]) 02/20/2016  . Glaucoma   . Kidney stones     Past Surgical  History:  Procedure Laterality Date  . IR GENERIC HISTORICAL  02/20/2016   IR THORACENTESIS ASP PLEURAL SPACE W/IMG GUIDE 02/20/2016 [facility]  . none       reports that he has never smoked. He has never used smokeless tobacco. He reports that he drinks alcohol. He reports that he does not use drugs.  Allergies  Allergen Reactions  . Dye Fdc Red [Red Dye]     Had reaction to dye treatment from kidney stones  . Erythromycin Nausea And Vomiting  . Penicillins Hives    Has patient had a PCN reaction causing immediate rash, facial/tongue/throat swelling, SOB or lightheadedness with hypotension: Yes Has patient had a PCN reaction causing severe rash involving mucus membranes or skin necrosis: no Has patient had a PCN reaction that required hospitalization: no Has patient had a PCN reaction occurring within the last 10 years:no If none of the above answers are "NO", then may proceed with Cephalosporin use.     Family History  Problem Relation Age of Onset  . Diabetes Paternal Grandmother   . Colon cancer Mother   . Heart Problems Maternal Grandmother     Prior to Admission medications   Medication Sig Start Date End Date Taking? Authorizing Provider  calcium carbonate (TUMS - DOSED IN MG ELEMENTAL CALCIUM) 500 MG chewable tablet Chew 1 tablet by mouth daily as needed for indigestion or heartburn.   Yes Historical Provider, MD  clonazePAM (KLONOPIN) 1 MG tablet Take 1 mg by mouth at bedtime.  02/18/16  Yes Historical Provider, MD  Dorzolamide HCl-Timolol  Mal PF 22.3-6.8 MG/ML SOLN Apply 22.3 mg to eye 2 (two) times daily. One drop in both eyes.   Yes Historical Provider, MD  HYDROcodone-acetaminophen (NORCO) 7.5-325 MG tablet Take 0.5-1 tablets by mouth every 8 (eight) hours as needed for moderate pain or severe pain.  01/30/16  Yes Historical Provider, MD  sildenafil (REVATIO) 20 MG tablet Take 20 mg by mouth as needed (erectile dysfunction).    Yes Historical Provider, MD  aspirin EC  81 MG tablet Take 1 tablet (81 mg total) by mouth daily. Patient not taking: Reported on 02/19/2016 01/09/14   Jettie Booze, MD  pravastatin (PRAVACHOL) 20 MG tablet TAKE ONE TABLET BY MOUTH EVERY OTHER DAY Patient not taking: Reported on 02/23/2016 12/31/15   Jettie Booze, MD    Physical Exam: Vitals:   03/03/2016 1400 02/16/2016 1430 02/09/2016 1500 03/01/2016 1515  BP: 111/63 107/66 105/57 109/57  Pulse: 103 102 109 110  Resp: '25 25 26 24  '$ SpO2: 90% 91% 92% (!) 89%  Weight:      Height:        Constitutional: NAD, calm, comfortable Vitals:   02/22/2016 1400 02/11/2016 1430 02/14/2016 1500 02/12/2016 1515  BP: 111/63 107/66 105/57 109/57  Pulse: 103 102 109 110  Resp: '25 25 26 24  '$ SpO2: 90% 91% 92% (!) 89%  Weight:      Height:       Eyes: PERRL, lids and conjunctivae normal ENMT: Mucous membranes are moist. Posterior pharynx clear of any exudate or lesions. Respiratory: decreased breath sounds on right,  Cardiovascular: Regular rate and rhythm, no murmurs / rubs / gallops. No extremity edema. 2+ pedal pulses. Abdomen: no tenderness, no masses palpated. Bowel sounds positive.  Musculoskeletal: no clubbing / cyanosis. Skin: no rashes, lesions, ulcers. No induration Neurologic: non focal  Psychiatric: Normal judgment and insight. Alert and oriented x 3. Normal mood.   Labs on Admission: I have personally reviewed following labs and imaging studies  CBC:  Recent Labs Lab 02/20/16 0839 02/20/2016 1010  WBC 23.3* 46.7*  NEUTROABS 17.0* 40.2*  HGB 12.6* 10.1*  HCT 37.8* 29.7*  MCV 89.2 87.1  PLT 385 235*   Basic Metabolic Panel:  Recent Labs Lab 02/20/16 0839 02/14/2016 1010  NA 137 132*  K 4.6 5.1  CL  --  97*  CO2 25 22  GLUCOSE 125 153*  BUN 14.3 30*  CREATININE 1.1 1.40*  CALCIUM 9.4 8.8*   GFR: Estimated Creatinine Clearance: 44.8 mL/min (by C-G formula based on SCr of 1.4 mg/dL). Liver Function Tests:  Recent Labs Lab 02/20/16 0839 03/02/2016 1010  AST  24 42*  ALT 55 39  ALKPHOS 269* 208*  BILITOT 1.00 2.3*  PROT 7.3 7.2  ALBUMIN 2.1* 1.9*   No results for input(s): LIPASE, AMYLASE in the last 168 hours. No results for input(s): AMMONIA in the last 168 hours. Coagulation Profile:  Recent Labs Lab 02/04/2016 1010  INR 1.21   Cardiac Enzymes:  Recent Labs Lab 02/08/2016 1010  TROPONINI <0.03   BNP (last 3 results) No results for input(s): PROBNP in the last 8760 hours. HbA1C: No results for input(s): HGBA1C in the last 72 hours. CBG: No results for input(s): GLUCAP in the last 168 hours. Lipid Profile: No results for input(s): CHOL, HDL, LDLCALC, TRIG, CHOLHDL, LDLDIRECT in the last 72 hours. Thyroid Function Tests: No results for input(s): TSH, T4TOTAL, FREET4, T3FREE, THYROIDAB in the last 72 hours. Anemia Panel: No results for input(s): VITAMINB12, FOLATE, FERRITIN,  TIBC, IRON, RETICCTPCT in the last 72 hours. Urine analysis:    Component Value Date/Time   COLORURINE YELLOW 12/20/2011 0956   APPEARANCEUR CLEAR 12/20/2011 0956   LABSPEC 1.009 12/20/2011 0956   PHURINE 6.5 12/20/2011 0956   GLUCOSEU NEGATIVE 12/20/2011 0956   HGBUR NEGATIVE 12/20/2011 0956   BILIRUBINUR NEGATIVE 12/20/2011 0956   KETONESUR NEGATIVE 12/20/2011 0956   PROTEINUR NEGATIVE 12/20/2011 0956   UROBILINOGEN 0.2 12/20/2011 0956   NITRITE NEGATIVE 12/20/2011 0956   LEUKOCYTESUR NEGATIVE 12/20/2011 0956   Sepsis Labs: '@LABRCNTIP'$ (procalcitonin:4,lacticidven:4) )No results found for this or any previous visit (from the past 240 hour(s)).   Radiological Exams on Admission: Dg Chest 2 View  Result Date: 02/20/2016 CLINICAL DATA:  Right pleural effusion. EXAM: CHEST  2 VIEW COMPARISON:  Radiograph of February 20, 2016. FINDINGS: Stable cardiomediastinal silhouette. Left lung is clear. No pneumothorax is noted. Large right pleural effusion is noted which is significantly enlarged compared to prior exam. Rounded pleural-based mass is noted in right  lung apex concerning for neoplasm or metastatic disease. Bony thorax is unremarkable. IMPRESSION: Large right pleural effusion is noted which is significantly enlarged compared to prior exam. Rounded pleural-based mass seen in right lung apex consistent with malignancy or metastatic disease. Electronically Signed   By: Marijo Conception, M.D.   On: 02/07/2016 14:05    EKG: Independently reviewed. Sinus rhythm  Assessment/Plan Active Problems:   Mixed hyperlipidemia   Lung mass   Hemothorax   Malignant neoplasm of upper lobe of right lung (HCC)   Shortness of breath   Chest wall pain    Hemothorax - Discussed with interventional radiology at bedside, patient underwent thoracentesis last week, they noticed a clear difference in the aspirated fluid, it appears to be bright red blood, we'll send a hematocrit, consult thoracic surgery for evaluation  - Patient was supposed to see Dr. Servando Snare and be evaluated for a Pleurx catheter  - Obtain a CT scan of the chest - Patient has significant leukocytosis, had a cough, cannot completely exclude pneumonia, cover broad-spectrum with vancomycin and cefepime, CT scan is pending  Stage IV non-small cell carcinoma with malignant pleural effusion  - will call Dr. Earlie Server and Dr. 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 Roxan Hockey  Admission status: inpatient    Marzetta Board, MD [facility] Pager [phone]  If 7PM-7AM, please contact  night-coverage [email or web address] Password [facility]  02/08/2016, 3:43 PM

## 2016-02-26 NOTE — Progress Notes (Signed)
Notified by Rowe Robert, PA patient Pleurx drain placement procedure cancelled due to elevated WBC.  Advised patient needed transfer to ED for further evaluation.  Charge RN in ED notified via phone who advised patient go to room 6.  Patient transported to ED via stretcher.  Report given to Laurice Record, RN.

## 2016-02-26 NOTE — Procedures (Addendum)
Ultrasound-guided diagnostic and therapeutic right thoracentesis performed yielding 1.5 liters of bloody fluid. No immediate complications. Follow-up CT chest pending. The fluid was sent to the lab for preordered studies. Primary MD made aware of findings.

## 2016-02-26 NOTE — ED Provider Notes (Signed)
[place] [facility] DEPT Provider Note   CSN: [id number] Arrival date & time: 02/05/2016  1202     History   Chief Complaint Chief Complaint  Patient presents with  . Weakness  . Abnormal Lab    HPI Wesley Coleman is a 75 y.o. male.  The history is provided by the patient.  Weakness  Associated symptoms include shortness of breath. Pertinent negatives include no chest pain, no abdominal pain and no headaches.  Abnormal Lab  Patient was sent from interventional radiology for generalized weakness and leukocytosis. He has a history of right-sided lung cancer. Reportedly stage IV. Has had previous thoracentesis around a week ago. He was scheduled to get a Pleurx catheter placed today but found to have white counts of 37. Also mildly elevated creatinine of 1.4. Alkaline phosphatase also mildly elevated at 208. Bilirubin at 2.3. No fevers but has been weak. Occasional cough. No dysuria. Did not have chest x-ray done today. No abdominal tenderness.   Past Medical History:  Diagnosis Date  . Cancer of right lung ([place]) 02/20/2016  . Glaucoma   . Kidney stones     Patient Active Problem List   Diagnosis Date Noted  . Malignant neoplasm of upper lobe of right lung ([place]) 02/24/2016  . Shortness of breath 02/24/2016  . Chest wall pain 02/24/2016  . Hypotension 02/24/2016  . Situational depression 02/24/2016  . Pleural effusion, right 02/20/2016  . Lung mass 01/31/2016  . Nonspecific abnormal results of cardiovascular function study 05/09/2013  . Mixed hyperlipidemia 05/09/2013  . Kidney stones   . Glaucoma     Past Surgical History:  Procedure Laterality Date  . IR GENERIC HISTORICAL  02/20/2016   IR THORACENTESIS ASP PLEURAL SPACE W/IMG GUIDE 02/20/2016 [facility]  . none         Home Medications    Prior to Admission medications   Medication Sig Start Date End Date Taking? Authorizing Provider  calcium carbonate (TUMS - DOSED IN MG ELEMENTAL CALCIUM) 500 MG chewable  tablet Chew 1 tablet by mouth daily as needed for indigestion or heartburn.   Yes Historical Provider, MD  clonazePAM (KLONOPIN) 1 MG tablet Take 1 mg by mouth at bedtime.  02/18/16  Yes Historical Provider, MD  Dorzolamide HCl-Timolol Mal PF 22.3-6.8 MG/ML SOLN Apply 22.3 mg to eye 2 (two) times daily. One drop in both eyes.   Yes Historical Provider, MD  HYDROcodone-acetaminophen (NORCO) 7.5-325 MG tablet Take 0.5-1 tablets by mouth every 8 (eight) hours as needed for moderate pain or severe pain.  01/30/16  Yes Historical Provider, MD  sildenafil (REVATIO) 20 MG tablet Take 20 mg by mouth as needed (erectile dysfunction).    Yes Historical Provider, MD  aspirin EC 81 MG tablet Take 1 tablet (81 mg total) by mouth daily. Patient not taking: Reported on 03/05/2016 01/09/14   Jettie Booze, MD  pravastatin (PRAVACHOL) 20 MG tablet TAKE ONE TABLET BY MOUTH EVERY OTHER DAY Patient not taking: Reported on 02/09/2016 12/31/15   Jettie Booze, MD    Family History Family History  Problem Relation Age of Onset  . Diabetes Paternal Grandmother   . Colon cancer Mother   . Heart Problems Maternal Grandmother     Social History Social History  Substance Use Topics  . Smoking status: Never Smoker  . Smokeless tobacco: Never Used  . Alcohol use Yes     Comment: one beer once in a while     Allergies   Dye fdc red [  red dye]; Erythromycin; and Penicillins   Review of Systems Review of Systems  Constitutional: Positive for appetite change and fatigue.  HENT: Negative for trouble swallowing.   Respiratory: Positive for shortness of breath.   Cardiovascular: Negative for chest pain.  Gastrointestinal: Negative for abdominal pain, constipation, nausea and vomiting.  Endocrine: Negative for polyphagia and polyuria.  Genitourinary: Negative for flank pain.  Musculoskeletal: Negative for back pain.  Neurological: Positive for weakness. Negative for headaches.  Psychiatric/Behavioral:  Negative for confusion.     Physical Exam Updated Vital Signs BP 108/66   Pulse 102   Resp 22   Ht '5\' 8"'$  (1.727 m)   Wt 170 lb (77.1 kg)   SpO2 92%   BMI 25.85 kg/m   Physical Exam  Constitutional: He appears well-developed.  Patient appears generally weak.  HENT:  Head: Atraumatic.  Eyes: EOM are normal.  Pulmonary/Chest:  Decreased breath sounds on right side.  Abdominal: There is no tenderness.  Musculoskeletal: He exhibits no edema.  Neurological: He is alert.  Skin: Skin is warm. Capillary refill takes less than 2 seconds.     ED Treatments / Results  Labs (all labs ordered are listed, but only abnormal results are displayed) Labs Reviewed  I-STAT CG4 LACTIC ACID, ED - Abnormal; Notable for the following:       Result Value   Lactic Acid, Venous 4.05 (*)    All other components within normal limits  CULTURE, BLOOD (ROUTINE X 2)  CULTURE, BLOOD (ROUTINE X 2)  URINE CULTURE  URINALYSIS, ROUTINE W REFLEX MICROSCOPIC (NOT AT [facility])    EKG  EKG Interpretation  Date/Time:  Wednesday February 26 2016 12:16:09 EDT Ventricular Rate:  103 PR Interval:    QRS Duration: 84 QT Interval:  346 QTC Calculation: 453 R Axis:   56 Text Interpretation:  Sinus tachycardia Borderline ST elevation, lateral leads Baseline wander in lead(s) V6 Confirmed by Alvino Chapel  MD, Shanaya Schneck [phone]) on 02/09/2016 1:05:57 PM       Radiology Dg Chest 2 View  Result Date: 02/18/2016 CLINICAL DATA:  Right pleural effusion. EXAM: CHEST  2 VIEW COMPARISON:  Radiograph of February 20, 2016. FINDINGS: Stable cardiomediastinal silhouette. Left lung is clear. No pneumothorax is noted. Large right pleural effusion is noted which is significantly enlarged compared to prior exam. Rounded pleural-based mass is noted in right lung apex concerning for neoplasm or metastatic disease. Bony thorax is unremarkable. IMPRESSION: Large right pleural effusion is noted which is significantly enlarged compared to prior  exam. Rounded pleural-based mass seen in right lung apex consistent with malignancy or metastatic disease. Electronically Signed   By: Marijo Conception, M.D.   On: 02/22/2016 14:05    Procedures Procedures (including critical care time)  Medications Ordered in ED Medications  sodium chloride 0.9 % bolus 1,000 mL (1,000 mLs Intravenous New Bag/Given 02/04/2016 1315)    And  sodium chloride 0.9 % bolus 500 mL (not administered)  vancomycin (VANCOCIN) IVPB 1000 mg/200 mL premix (1,000 mg Intravenous New Bag/Given 02/25/2016 1406)  vancomycin (VANCOCIN) IVPB 750 mg/150 ml premix (not administered)  ceFEPIme (MAXIPIME) 2 g in dextrose 5 % 50 mL IVPB (not administered)  sodium chloride 0.9 % bolus 1,000 mL (1,000 mLs Intravenous New Bag/Given 02/21/2016 1248)  ceFEPIme (MAXIPIME) 2 g in dextrose 5 % 50 mL IVPB (0 g Intravenous Stopped 02/21/2016 1352)     Initial Impression / Assessment and Plan / ED Course  I have reviewed the triage vital signs and the  nursing notes.  Pertinent labs & imaging results that were available during my care of the patient were reviewed by me and considered in my medical decision making (see chart for details).  Clinical Course    Patient presents from interventional radiology. Found to have leukocytosis of 47 and some mild hypotension. Reportedly baseline blood pressure is somewhat low. Has pleural effusion and was supposed to get Pleurx catheter placed. Lactic acid is elevated. Blood pressures improved. Will start empiric antibiotics. Discussed with interventional radiology who will come see the patient about possible bedside drainage to look for infected effusion. Will admit to internal medicine.  Blood cultures been sent. No clear source of infection. It is possible that the leukocytosis and elevated lactate are from general illness dehydration and not a sepsis. On reexam patient has had increased blood pressure. Central capillary refill is right around 3  seconds. CRITICAL CARE Performed by: Mackie Pai Total critical care time: 67mnutes Critical care time was exclusive of separately billable procedures and treating other patients. Critical care was necessary to treat or prevent imminent or life-threatening deterioration. Critical care was time spent personally by me on the following activities: development of treatment plan with patient and/or surrogate as well as nursing, discussions with consultants, evaluation of patient's response to treatment, examination of patient, obtaining history from patient or surrogate, ordering and performing treatments and interventions, ordering and review of laboratory studies, ordering and review of radiographic studies, pulse oximetry and re-evaluation of patient's condition.  Final Clinical Impressions(s) / ED Diagnoses   Final diagnoses:  [place]atic [place]  Recurrent pleural effusion on right  Leukocytosis  Renal insufficiency    New Prescriptions New Prescriptions   No medications on file     NDavonna Belling MD 02/12/2016 1445

## 2016-02-26 NOTE — Progress Notes (Signed)
Pharmacy Antibiotic Note  Wesley Coleman is a 75 y.o. male s/p right thoracentesis on 8/17 with cytology positive for metastatic [place] carcinoma with plan to start XRT 8/24.  Patient was referred to the ED on 8/23 for further w/u of CP, weakness, and leukocytosis.  LA was found to be elevated in the ED.  To start broad abx with vancomycin and zosyn for suspected sepsis.  Plan: - vancomycin 1gm IV x1, then 750 mg IV q12h - cefepime 2gm IV q8h (Dr. Alvino Chapel is aware of PCN allergy.  Will monitor pt closely with first dose of cefepime) - monitor renal function closely and adjust dose if/when needed  _________________________     Temp (24hrs), Avg:98.3 F (36.8 C), Min:98.3 F (36.8 C), Max:98.3 F (36.8 C)   Recent Labs Lab 02/20/16 0839 02/15/2016 1010 02/19/2016 1240  WBC 23.3* 46.7*  --   CREATININE 1.1 1.40*  --   LATICACIDVEN  --   --  4.05*    Estimated Creatinine Clearance: 44.8 mL/min (by C-G formula based on SCr of 1.4 mg/dL).    Allergies  Allergen Reactions  . Dye Fdc Red [Red Dye]     Had reaction to dye treatment from kidney stones  . Erythromycin Nausea And Vomiting  . Penicillins Hives    Has patient had a PCN reaction causing immediate rash, facial/tongue/throat swelling, SOB or lightheadedness with hypotension: Yes Has patient had a PCN reaction causing severe rash involving mucus membranes or skin necrosis: no Has patient had a PCN reaction that required hospitalization: no Has patient had a PCN reaction occurring within the last 10 years:no If none of the above answers are "NO", then may proceed with Cephalosporin use.      Thank you for allowing pharmacy to be a part of this patient's care.  Lynelle Doctor 02/10/2016 12:52 PM

## 2016-02-26 NOTE — Progress Notes (Signed)
CRITICAL VALUE ALERT  Critical value received:  Glucose (pleural fluid) <20  Date of notification:  02/06/2016  Time of notification:  [place] value read back:Yes.    Nurse who received alert:  Mendel Corning  MD notified (1st page):  Raliegh Ip Schorr  Time of first page:  1927  MD notified (2nd page):  Time of second page:  Responding MD:    Time MD responded:

## 2016-02-26 NOTE — Progress Notes (Addendum)
CRITICAL VALUE ALERT  Critical value received:  Lactic Acid 4.05  Date of notification:  02/12/2016  Time of notification:  1943  Critical value read back:Yes.    Nurse who received alert:  Mendel Corning  MD notified (1st page):  Raliegh Ip Schorr  Time of first page:  1944  MD notified (2nd page):  Time of second page:  Responding MD:    Time MD responded:

## 2016-02-27 ENCOUNTER — Ambulatory Visit
Admission: RE | Admit: 2016-02-27 | Discharge: 2016-02-27 | Disposition: A | Discharge status: Home / Self Care | Payer: Medicare Other | Source: Ambulatory Visit | Attending: Radiation Oncology | Admitting: Radiation Oncology

## 2016-02-27 ENCOUNTER — Encounter: Payer: Self-pay | Admitting: *Deleted

## 2016-02-27 ENCOUNTER — Telehealth: Payer: Self-pay | Admitting: *Deleted

## 2016-02-27 DIAGNOSIS — C801 Malignant (primary) neoplasm, unspecified: Secondary | ICD-10-CM

## 2016-02-27 DIAGNOSIS — R0602 Shortness of breath: Secondary | ICD-10-CM

## 2016-02-27 DIAGNOSIS — C3411 Malignant neoplasm of upper lobe, right bronchus or lung: Secondary | ICD-10-CM

## 2016-02-27 DIAGNOSIS — C799 Secondary malignant neoplasm of unspecified site: Secondary | ICD-10-CM

## 2016-02-27 LAB — BASIC METABOLIC PANEL
Anion gap: 7 (ref 5–15)
BUN: 29 mg/dL — ABNORMAL HIGH (ref 6–20)
CALCIUM: 8.1 mg/dL — AB (ref 8.9–10.3)
CO2: 22 mmol/L (ref 22–32)
CREATININE: 1.21 mg/dL (ref 0.61–1.24)
Chloride: 106 mmol/L (ref 101–111)
GFR, EST NON AFRICAN AMERICAN: 57 mL/min — AB (ref >60)
GLUCOSE: 141 mg/dL — AB (ref 65–99)
Potassium: 4.2 mmol/L (ref 3.5–5.1)
Sodium: 135 mmol/L (ref 135–145)

## 2016-02-27 LAB — LACTIC ACID, PLASMA: LACTIC ACID, VENOUS: 2.6 mmol/L — AB (ref 0.5–1.9)

## 2016-02-27 LAB — CBC
HCT: 24.3 % — ABNORMAL LOW (ref 39.0–52.0)
Hemoglobin: 8.3 g/dL — ABNORMAL LOW (ref 13.0–17.0)
MCH: 30 pg (ref 26.0–34.0)
MCHC: 34.2 g/dL (ref 30.0–36.0)
MCV: 87.7 fL (ref 78.0–100.0)
PLATELETS: 436 10*3/uL — AB (ref 150–400)
RBC: 2.77 MIL/uL — AB (ref 4.22–5.81)
RDW: 13.5 % (ref 11.5–15.5)
WBC: 41.1 10*3/uL — ABNORMAL HIGH (ref 4.0–10.5)

## 2016-02-27 MED ORDER — SONAFINE EX EMUL
1.0000 "application " | Freq: Two times a day (BID) | CUTANEOUS | Status: DC
  Administered 2016-02-27: 1 via TOPICAL

## 2016-02-27 MED ORDER — HYDROCODONE-ACETAMINOPHEN 7.5-325 MG PO TABS
0.5000 | ORAL_TABLET | Freq: Three times a day (TID) | ORAL | Status: DC | PRN
  Administered 2016-02-27: 0.5 via ORAL
  Administered 2016-02-28: 1 via ORAL
  Filled 2016-02-27 (×2): qty 1

## 2016-02-27 MED ORDER — DORZOLAMIDE HCL-TIMOLOL MAL 2-0.5 % OP SOLN
1.0000 [drp] | Freq: Two times a day (BID) | OPHTHALMIC | Status: DC
  Administered 2016-02-27 – 2016-02-28 (×4): 1 [drp] via OPHTHALMIC
  Filled 2016-02-27: qty 10

## 2016-02-27 NOTE — Progress Notes (Signed)
Patient education reviewed, sonafine cream, my business card, Radiation therapy and yo book given to the patient, discussed briefly side effects and ways to manage, sonafine cream to apply to chest area where radiation was done daily after radiation treatment, skin irritation, fatigue, difficulty swallowing , hair loss, throat chagres, cough, may need for IV Fluids, weight loss, may need to eat smaller softer meals, increase high  calorie drinks and protein in diet, sees MD weekly and prn, will discuss further tomorrow , marked specific pages for patient and wife to read, verbal understanding,by patient 9:28 AM

## 2016-02-27 NOTE — Progress Notes (Signed)
[facility] Therapy Treatment Record Phone [phone]   Radiation Therapy was administered to Wesley Coleman on: 02/27/2016  9:22 AM and was treatment # 1 out of a planned course of 10 treatments.  Radiation Treatment  1). Beam photons with 6-10 energy  2). Brachytherapy None  3). Stereotactic Radiosurgery None  4). Other Radiation None     Wesley Coleman Wesley Coleman, RT

## 2016-02-27 NOTE — Telephone Encounter (Signed)
Called the floor for patient in room 1235, spoke with RN Langley Gauss, patient is to have port and treat on chest radiation today at 9am instead of 1230, patient is able to vome via bed, asked if patient was in pain? NO Stated Therapist, sports, can be treated and she is aware of time chabge, thanked Therapist, sports, called [facility] and spoke with RT Heather 8:43 AM

## 2016-02-27 NOTE — Progress Notes (Signed)
Oncology Nurse Navigator Documentation  Oncology Nurse Navigator Flowsheets 02/27/2016  Navigator Encounter Type Other/I followed up with [facility].  Mr. Serio cytology was [facility] on 02/24/16.  Dr. Julien Nordmann updated  Treatment Phase Pre-Tx/Tx Discussion  Barriers/Navigation Needs Coordination of Care  Interventions Coordination of Care  Coordination of Care Other  Acuity Level 1  Acuity Level 1 Minimal follow up required  Time Spent with Patient 30

## 2016-02-27 NOTE — Progress Notes (Signed)
Progress Note    Wesley Coleman  XAJ:[id number] DOB: 13-May-1941  DOA: 02/06/2016 PCP: Mathews Argyle, MD    Brief Narrative:   Wesley Coleman is an 75 y.o. 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 Earlie Server, scheduled to start radiation therapy on 02/27/16 followed by chemotherapy, who was admitted 03/01/2016 with a chief complaint of weakness and shortness of breath. Chest x-ray on admission showed a large right pleural effusion for which she underwent thoracentesis 02/09/2016 yielding 1.5 L of bloody fluid. He was also noted to have a WBC of 40K and an elevated lactic acid of 4.  Assessment/Plan:   Principal Problem:   Acute hypoxic respiratory failure secondary to hemothorax in the setting of newly diagnosed stage IV non-small cell lung carcinoma and possible underlying pneumonia 1.5 L thoracentesis performed on admission yielding bloody fluid. Follow-up pleural fluid cultures. CVTS consulted. Oncology notified of the patient's admission. Scheduled to start radiation therapy today. To address possible underlying pneumonia, patient was placed on vancomycin and cefepime. CT of the chest showed increase in size in the right upper lobe mass. Continue supplemental oxygen. Repeat lactic acid.  Active Problems:   Normocytic anemia Likely anemia of chronic disease. Monitor hemoglobin. No current indication for transfusion.    Mixed hyperlipidemia Pravachol on hold. Alkaline phosphatase/bilirubin slightly elevated.    Chest wall pain Continue hydrocodone as needed for pain.    Leukocytosis Treating with broad-spectrum antibiotics due to concerns for hospital acquired pneumonia.    AKI (acute kidney injury) (HCC)/hyponatremia Continue gentle IV fluids.    Family Communication/Anticipated D/C date and plan/Code Status   DVT prophylaxis: SCDs ordered. Code Status: Full Code.  Family Communication: No  family at the bedside. Disposition Plan: Home in several days, once respiratory status improves.   Medical Consultants:    Radiation Oncology  CVTS   Procedures:    Thoracentesis 02/14/2016  Anti-Infectives:   Cefepime 03/01/2016---> Vancomycin 02/06/2016--->  Subjective:    Wesley Coleman is still a bit short of breath.  Although he seems confused, he is oriented.  He did not understand that he received a XRT treatment today.  Objective:    Vitals:   03/01/2016 2000 02/27/16 0000 02/27/16 0400 02/27/16 0600  BP:  (!) 106/53 94/60 (!) 100/53  Pulse: (!) 109 (!) 101 91 87  Resp: (!) 25 (!) 25 (!) 22 (!) 23  Temp: 98.4 F (36.9 C) (!) 101.1 F (38.4 C) 97.3 F (36.3 C)   TempSrc: Oral Axillary Axillary   SpO2: 96% 92% 95% 97%  Weight:      Height:        Intake/Output Summary (Last 24 hours) at 02/27/16 0750 Last data filed at 02/27/16 0645  Gross per 24 hour  Intake           4022.5 ml  Output              300 ml  Net           3722.5 ml   Filed Weights   02/21/2016 1303 03/01/2016 1810  Weight: 77.1 kg (170 lb) 77.6 kg (171 lb 1.2 oz)    Exam: General exam: Appears anxious. Respiratory system: Diminished on the right. Respiratory effort increased. Cardiovascular system: S1 & S2 heard, regular/tachy.  No JVD,  rubs, gallops or clicks. No murmurs. Gastrointestinal system: Abdomen is nondistended, soft and nontender. No organomegaly or masses felt. Normal bowel sounds  heard. Central nervous system: Alert and oriented. No focal neurological deficits. Extremities: No clubbing,  or cyanosis. No edema. Skin: No rashes, lesions or ulcers. Psychiatry: Judgement and insight appear normal. Mood & affect anxious.   Data Reviewed:   I have personally reviewed following labs and imaging studies:  Labs: Basic Metabolic Panel:  Recent Labs Lab 02/20/16 0839 02/16/2016 1010 02/27/16 0325  NA 137 132* 135  K 4.6 5.1 4.2  CL  --  97* 106  CO2 '25 22 22  '$ GLUCOSE 125  153* 141*  BUN 14.3 30* 29*  CREATININE 1.1 1.40* 1.21  CALCIUM 9.4 8.8* 8.1*   GFR Estimated Creatinine Clearance: 51.8 mL/min (by C-G formula based on SCr of 1.21 mg/dL). Liver Function Tests:  Recent Labs Lab 02/20/16 0839 02/06/2016 1010  AST 24 42*  ALT 55 39  ALKPHOS 269* 208*  BILITOT 1.00 2.3*  PROT 7.3 7.2  ALBUMIN 2.1* 1.9*   Coagulation profile  Recent Labs Lab 02/25/2016 1010  INR 1.21    CBC:  Recent Labs Lab 02/20/16 0839 03/05/2016 1010 02/15/2016 1552 02/27/16 0325  WBC 23.3* 46.7*  --  41.1*  NEUTROABS 17.0* 40.2*  --   --   HGB 12.6* 10.1*  --  8.3*  HCT 37.8* 29.7* 28.7* 24.3*  MCV 89.2 87.1  --  87.7  PLT 385 481*  --  436*   Cardiac Enzymes:  Recent Labs Lab 02/07/2016 1010  TROPONINI <0.03   Sepsis Labs:  Recent Labs Lab 02/20/16 0839 02/24/2016 1010 02/21/2016 1240 02/13/2016 1911 02/27/16 0325  WBC 23.3* 46.7*  --   --  41.1*  LATICACIDVEN  --   --  4.05* 4.0*  --     Microbiology Recent Results (from the past 240 hour(s))  Gram stain     Status: None   Collection Time: 03/02/2016  3:30 PM  Result Value Ref Range Status   Specimen Description FLUID RIGHT PLEURAL  Final   Special Requests NONE  Final   Gram Stain   Final    ABUNDANT WBC PRESENT, PREDOMINANTLY PMN NO ORGANISMS SEEN Performed at [facility]    Report Status 02/08/2016 FINAL  Final  MRSA PCR Screening     Status: None   Collection Time: 03/04/2016  5:40 PM  Result Value Ref Range Status   MRSA by PCR NEGATIVE NEGATIVE Final    Comment:        The GeneXpert MRSA Assay (FDA approved for NASAL specimens only), is one component of a comprehensive MRSA colonization surveillance program. It is not intended to diagnose MRSA infection nor to guide or monitor treatment for MRSA infections.     Radiology: Dg Chest 2 View  Result Date: 02/28/2016 CLINICAL DATA:  Right pleural effusion. EXAM: CHEST  2 VIEW COMPARISON:  Radiograph of February 20, 2016. FINDINGS:  Stable cardiomediastinal silhouette. Left lung is clear. No pneumothorax is noted. Large right pleural effusion is noted which is significantly enlarged compared to prior exam. Rounded pleural-based mass is noted in right lung apex concerning for neoplasm or metastatic disease. Bony thorax is unremarkable. IMPRESSION: Large right pleural effusion is noted which is significantly enlarged compared to prior exam. Rounded pleural-based mass seen in right lung apex consistent with malignancy or metastatic disease. Electronically Signed   By: Marijo Conception, M.D.   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  Hypermetabolic lymph node metastases also identified by PET scan. Clinical deterioration and leukocytosis. EXAM: CT CHEST WITH CONTRAST TECHNIQUE: Multidetector CT imaging of the chest was performed during intravenous contrast administration. CONTRAST:  60m ISOVUE-300 IOPAMIDOL (ISOVUE-300) INJECTION 61% COMPARISON:  PET scan on 02/06/2016 and CT of the chest on 01/29/2016 FINDINGS: Cardiovascular: No visualized pulmonary emboli. The thoracic aorta is normal in caliber. The heart size is normal access. There is no evidence of pericardial fluid. Mediastinum/Nodes: Precarinal lymph node measures 13 mm in short axis and shows slight increase in size since the prior PET scan. 11 mm lower right peritracheal lymph node appears stable. Lungs/Pleura: There is a large and loculated right pleural effusion causing drowned lung with very little aeration of the lower lobe and middle lobe. There is some aeration remaining of the upper lobe. Posterior right upper lobe lung mass shows slight increase in size and now measures approximately 4.5 x 7.3 cm (approximately 4.3 x 6.3 cm on the prior chest CT in July). Irregular pleural enhancement is present throughout the hemi thorax consistent with  pleural metastatic disease. No overt pulmonary edema. The contralateral left lung demonstrates no infiltrates or masses. Upper Abdomen: Unremarkable. Musculoskeletal: Stable appearance of metastatic lesions involving the inferior tip of the left scapula and the distal aspect of the left seventh rib. IMPRESSION: Large loculated right pleural effusion with evidence of right sided pleural metastatic disease. The right lower and middle lobes are drowned. Posterior right upper lobe lung mass shows slight increase in size since prior imaging. A precarinal lymph node shows slight increase in size since prior imaging. Electronically Signed   By: GAletta EdouardM.D.   On: 02/07/2016 17:05   UKoreaThoracentesis Asp Pleural Space W/img Guide  Result Date: 02/12/2016 INDICATION: Metastatic non-small-cell carcinoma, right lung mass, dyspnea, recurrent right pleural effusion. Request made for diagnostic and therapeutic right thoracentesis. EXAM: ULTRASOUND GUIDED DIAGNOSTIC AND THERAPEUTIC RIGHT THORACENTESIS MEDICATIONS: None. COMPLICATIONS: None immediate. PROCEDURE: An ultrasound guided thoracentesis was thoroughly discussed with the patient and questions answered. The benefits, risks, alternatives and complications were also discussed. The patient understands and wishes to proceed with the procedure. Written consent was obtained. Ultrasound was performed to localize and mark an adequate pocket of fluid in the right chest. The area was then prepped and draped in the normal sterile fashion. 1% Lidocaine was used for local anesthesia. Under ultrasound guidance a Safe-T-Centesis catheter was introduced. Thoracentesis was performed. The catheter was removed and a dressing applied. FINDINGS: A total of approximately 1.5 liters of bloody fluid was removed. Samples were sent to the laboratory as requested by the clinical team. IMPRESSION: Successful ultrasound guided diagnostic and therapeutic right thoracentesis yielding 1.5  liters of pleural fluid. Admitting physician made aware of above findings. Read by: KRowe Robert PA-C Electronically Signed   By: DLucrezia EuropeM.D.   On: 02/20/2016 16:37    Medications:   . ceFEPime (MAXIPIME) IV  2 g Intravenous Q8H  . clonazePAM  1 mg Oral QHS  . dorzolamide-timolol  1 drop Both Eyes BID  . vancomycin  750 mg Intravenous Q12H   Continuous Infusions: . sodium chloride 75 mL/hr at 02/27/16 0600    Time spent: 35 minutes.  The patient is medically complex with multiple co-morbidities and is at high risk for clinical deterioration and requires high complexity decision making.    LOS: 1 day   Shanira Tine  [facility] Pager [phone] If unable to reach me by pager, please call my cell phone at  [phone].  *Please refer to [email or web address], password [email or web address] to get updated schedule on who will round on this patient, as hospitalists switch teams weekly. If 7PM-7AM, please contact night-coverage at [email or web address], password [email or web address] for any overnight needs.  02/27/2016, 7:50 AM

## 2016-02-28 ENCOUNTER — Encounter: Payer: Self-pay | Admitting: *Deleted

## 2016-02-28 ENCOUNTER — Inpatient Hospital Stay (HOSPITAL_COMMUNITY): Payer: Medicare Other

## 2016-02-28 ENCOUNTER — Other Ambulatory Visit: Payer: Medicare Other

## 2016-02-28 ENCOUNTER — Ambulatory Visit: Payer: Medicare Other | Attending: Radiation Oncology | Admitting: Radiation Oncology

## 2016-02-28 ENCOUNTER — Ambulatory Visit: Payer: Medicare Other

## 2016-02-28 ENCOUNTER — Ambulatory Visit: Payer: Medicare Other | Admitting: Internal Medicine

## 2016-02-28 DIAGNOSIS — J90 Pleural effusion, not elsewhere classified: Secondary | ICD-10-CM

## 2016-02-28 DIAGNOSIS — D72829 Elevated white blood cell count, unspecified: Secondary | ICD-10-CM

## 2016-02-28 DIAGNOSIS — C3411 Malignant neoplasm of upper lobe, right bronchus or lung: Secondary | ICD-10-CM

## 2016-02-28 DIAGNOSIS — N179 Acute kidney failure, unspecified: Secondary | ICD-10-CM

## 2016-02-28 DIAGNOSIS — J9601 Acute respiratory failure with hypoxia: Secondary | ICD-10-CM

## 2016-02-28 LAB — BASIC METABOLIC PANEL
ANION GAP: 11 (ref 5–15)
BUN: 32 mg/dL — ABNORMAL HIGH (ref 6–20)
CALCIUM: 8.4 mg/dL — AB (ref 8.9–10.3)
CO2: 18 mmol/L — ABNORMAL LOW (ref 22–32)
Chloride: 106 mmol/L (ref 101–111)
Creatinine, Ser: 1.17 mg/dL (ref 0.61–1.24)
GFR, EST NON AFRICAN AMERICAN: 60 mL/min — AB (ref >60)
Glucose, Bld: 146 mg/dL — ABNORMAL HIGH (ref 65–99)
Potassium: 4.7 mmol/L (ref 3.5–5.1)
SODIUM: 135 mmol/L (ref 135–145)

## 2016-02-28 LAB — CBC
HEMATOCRIT: 24.4 % — AB (ref 39.0–52.0)
Hemoglobin: 8.3 g/dL — ABNORMAL LOW (ref 13.0–17.0)
MCH: 29.3 pg (ref 26.0–34.0)
MCHC: 34 g/dL (ref 30.0–36.0)
MCV: 86.2 fL (ref 78.0–100.0)
PLATELETS: 423 10*3/uL — AB (ref 150–400)
RBC: 2.83 MIL/uL — AB (ref 4.22–5.81)
RDW: 13.6 % (ref 11.5–15.5)
WBC: 48.6 10*3/uL — AB (ref 4.0–10.5)

## 2016-02-28 LAB — URINE CULTURE: CULTURE: NO GROWTH

## 2016-02-28 NOTE — Progress Notes (Signed)
Patient ID: JOEZIAH VOIT, male   DOB: 30-Nov-1940, 75 y.o.   MRN: [id number] Asked by primary team to perform another right thoracentesis (therapeutic) on pt; he is s/p portable rt thoracentesis in ED on 8/23 yielding 1.5 liters bloody fluid. On today's limited US post rt chest the pleural fluid collection appears much more complex with multiple deep loculated pockets of fluid present along with possible clot/consolidation. With such complexity of collection ability to remove large enough  fluid to impact breathing with small bore tube is low. Recommend TCTS consult- may require large bore chest tube for evacuation vs VATS but unsure of candidacy in this stage IV patient. Above d/w Dr. Lonny Prude. Images were reviewed by Dr. Kathlene Cote.

## 2016-02-28 NOTE — Progress Notes (Signed)
Oncology Nurse Navigator Documentation  Oncology Nurse Navigator Flowsheets 02/28/2016  Navigator Encounter Type Telephone/I received a call from Elvina Sidle ICU RN taking care of Mr. Hutsell.  She asked if I could speak to patient's wife.  I spoke with Ms. Caccamo.  She asked if I could update her on his condition.  I stated I was only aware he was not doing well and that is why he is in the ICU.  I stated she needed to speak with the hospital doctor to get more information.  I listened as she explained her feelings.  I encouraged her to speak with hospital doctors.  She was thankful for speaking with her.  I will update Dr. Julien Nordmann.    Telephone Incoming Call  Treatment Phase Other  Barriers/Navigation Needs Education  Education Other  Interventions Education Method  Coordination of Care Other  Education Method Verbal  Acuity Level 2  Acuity Level 2 Other  Time Spent with Patient 15

## 2016-02-28 NOTE — Progress Notes (Signed)
PROGRESS NOTE    Wesley Coleman  ULA:[id number] DOB: 09/30/1940 DOA: 03/03/2016 PCP: Mathews Argyle, MD   Brief Narrative: Wesley Coleman is a 75 year old male with a medical history of stage IV non-small cell carcinoma. He recently started radiation therapy on August 24. He presented with dyspnea and weakness and was found to have a right-sided hemothorax and concern for possible pneumonia. He was started on empiric antibiotics and pleural fluid sent for labs.   Assessment & Plan:   Principal Problem:   Hemothorax Active Problems:   Mixed hyperlipidemia   Lung mass   Malignant neoplasm of upper lobe of right lung (HCC)   Shortness of breath   Chest wall pain   Respiratory failure (HCC)   Metastatic cancer (HCC)   Leukocytosis   AKI (acute kidney injury) ([place])   Hemothorax, right-sided Lab sent for analysis. Previous thoracentesis yielded evidence of malignant cells. Cardiothoracic surgery was consult in for evaluation and possible placement of a catheter -Oxygen as needed -Follow-up cardiothoracic surgery recommendations -Follow-up pleural fluid culture and labs  Possible Community-acquired pneumonia, likely secondary to strep pneumoniae infection Acute hypoxic respiratory failure Afebrile but with productive cough. Has significant leukocytosis. 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 Patient started radiation therapy on August 24 (treatment #1 out of a planned 10 treatments) and tolerated it well. -Follow-up oncology recommendations  Chest wall pain -Continue hydrocodone  Acute kidney injury Improved  Hyperlipidemia Pravachol on hold  DVT prophylaxis: SCDs  Code Status: Full code Family Communication: None at bedside Disposition Plan: Stay in stepdown. Expect discharge in 2-3 days pending  improvement of respiratory status  Consultants:   Oncology  Cardiothoracic surgery  Procedures:  Thoracentesis (8/23  Antimicrobials:  Vancomycin (8/23>>  Cefepime (8/23>>   Subjective: Patient reports some dry mouth today. He has some pain in his right shoulder but otherwise reports no chest pain or shortness of breath. He does have a mild cough with productive sputum which is improved.   Objective: Vitals:   02/28/16 0400 02/28/16 0545 02/28/16 0600 02/28/16 0800  BP: (!) 75/40 (!) 96/48 (!) 81/55   Pulse: (!) 114 (!) 115 (!) 110   Resp: (!) 23     Temp: 97.6 F (36.4 C)   98.4 F (36.9 C)  TempSrc: Oral   Axillary  SpO2: 95% 98% 97%   Weight:      Height:        Intake/Output Summary (Last 24 hours) at 02/28/16 0838 Last data filed at 02/28/16 0000  Gross per 24 hour  Intake             1450 ml  Output              475 ml  Net              975 ml   Filed Weights   02/19/2016 1303 03/05/2016 1810  Weight: 77.1 kg (170 lb) 77.6 kg (171 lb 1.2 oz)    Examination:  General exam: Appears calm and comfortable Respiratory system: Diminished on right side with mild crackles. No increased rest or effort. Cardiovascular system: S1 & S2 heard, RRR. No murmurs, rubs, gallops or clicks. No pedal edema. Gastrointestinal system: Abdomen is nondistended, soft and nontender. No organomegaly or masses felt. Normal bowel sounds heard. Central nervous system: Alert and oriented. No focal neurological deficits. Has slightly slurred speech  which is baseline  Extremities: Symmetric 5 x 5 power. Skin: No rashes, lesions or ulcers Psychiatry: Judgement and insight appear normal. Mood & affect appropriate.     Data Reviewed: I have personally reviewed following labs and imaging studies  CBC:  Recent Labs Lab 03/01/2016 1010 03/05/2016 1552 02/27/16 0325 02/28/16 0317  WBC 46.7*  --  41.1* 48.6*  NEUTROABS 40.2*  --   --   --   HGB 10.1*  --  8.3* 8.3*  HCT 29.7* 28.7*  24.3* 24.4*  MCV 87.1  --  87.7 86.2  PLT 481*  --  436* 027*   Basic Metabolic Panel:  Recent Labs Lab 02/23/2016 1010 02/27/16 0325 02/28/16 0317  NA 132* 135 135  K 5.1 4.2 4.7  CL 97* 106 106  CO2 22 22 18*  GLUCOSE 153* 141* 146*  BUN 30* 29* 32*  CREATININE 1.40* 1.21 1.17  CALCIUM 8.8* 8.1* 8.4*   GFR: Estimated Creatinine Clearance: 53.6 mL/min (by C-G formula based on SCr of 1.17 mg/dL). Liver Function Tests:  Recent Labs Lab 03/03/2016 1010  AST 42*  ALT 39  ALKPHOS 208*  BILITOT 2.3*  PROT 7.2  ALBUMIN 1.9*   No results for input(s): LIPASE, AMYLASE in the last 168 hours. No results for input(s): AMMONIA in the last 168 hours. Coagulation Profile:  Recent Labs Lab 02/13/2016 1010  INR 1.21   Cardiac Enzymes:  Recent Labs Lab 02/18/2016 1010  TROPONINI <0.03   BNP (last 3 results) No results for input(s): PROBNP in the last 8760 hours. HbA1C: No results for input(s): HGBA1C in the last 72 hours. CBG: No results for input(s): GLUCAP in the last 168 hours. Lipid Profile: No results for input(s): CHOL, HDL, LDLCALC, TRIG, CHOLHDL, LDLDIRECT in the last 72 hours. Thyroid Function Tests: No results for input(s): TSH, T4TOTAL, FREET4, T3FREE, THYROIDAB in the last 72 hours. Anemia Panel: No results for input(s): VITAMINB12, FOLATE, FERRITIN, TIBC, IRON, RETICCTPCT in the last 72 hours. Sepsis Labs:  Recent Labs Lab 02/19/2016 1240 02/25/2016 1911 02/27/16 1103  LATICACIDVEN 4.05* 4.0* 2.6*    Recent Results (from the past 240 hour(s))  Culture, blood (routine x 2)     Status: None (Preliminary result)   Collection Time: 02/27/2016 12:30 PM  Result Value Ref Range Status   Specimen Description BLOOD LEFT ANTECUBITAL  Final   Special Requests BOTTLES DRAWN AEROBIC AND ANAEROBIC 5ML  Final   Culture   Final    NO GROWTH 1 DAY Performed at [facility]    Report Status PENDING  Incomplete  Culture, blood (routine x 2)     Status: None  (Preliminary result)   Collection Time: 02/21/2016  1:00 PM  Result Value Ref Range Status   Specimen Description BLOOD LEFT HAND  Final   Special Requests BOTTLES DRAWN AEROBIC AND ANAEROBIC 5CC  Final   Culture   Final    NO GROWTH < 24 HOURS Performed at [facility]    Report Status PENDING  Incomplete  Culture, body fluid-bottle     Status: None (Preliminary result)   Collection Time: 02/28/2016  3:30 PM  Result Value Ref Range Status   Specimen Description FLUID RIGHT PLEURAL  Final   Special Requests BOTTLES DRAWN AEROBIC AND ANAEROBIC 10CC  Final   Culture   Final    NO GROWTH < 24 HOURS Performed at [facility]    Report Status PENDING  Incomplete  Gram stain  Status: None   Collection Time: 02/25/2016  3:30 PM  Result Value Ref Range Status   Specimen Description FLUID RIGHT PLEURAL  Final   Special Requests NONE  Final   Gram Stain   Final    ABUNDANT WBC PRESENT, PREDOMINANTLY PMN NO ORGANISMS SEEN Performed at [facility]    Report Status 02/04/2016 FINAL  Final  MRSA PCR Screening     Status: None   Collection Time: 02/19/2016  5:40 PM  Result Value Ref Range Status   MRSA by PCR NEGATIVE NEGATIVE Final    Comment:        The GeneXpert MRSA Assay (FDA approved for NASAL specimens only), is one component of a comprehensive MRSA colonization surveillance program. It is not intended to diagnose MRSA infection nor to guide or monitor treatment for MRSA infections.          Radiology Studies: Dg Chest 2 View  Result Date: 02/19/2016 CLINICAL DATA:  Right pleural effusion. EXAM: CHEST  2 VIEW COMPARISON:  Radiograph of February 20, 2016. FINDINGS: Stable cardiomediastinal silhouette. Left lung is clear. No pneumothorax is noted. Large right pleural effusion is noted which is significantly enlarged compared to prior exam. Rounded pleural-based mass is noted in right lung apex concerning for neoplasm or metastatic disease. Bony thorax is  unremarkable. IMPRESSION: Large right pleural effusion is noted which is significantly enlarged compared to prior exam. Rounded pleural-based mass seen in right lung apex consistent with malignancy or metastatic disease. Electronically Signed   By: Marijo Conception, M.D.   On: 02/18/2016 14:05   Ct Chest W Contrast  Result Date: 02/06/2016 CLINICAL DATA:  Non-small-cell lung carcinoma metastatic to the pleural space with malignant pleural effusion. Hypermetabolic lymph node metastases also identified by PET scan. Clinical deterioration and leukocytosis. EXAM: CT CHEST WITH CONTRAST TECHNIQUE: Multidetector CT imaging of the chest was performed during intravenous contrast administration. CONTRAST:  68m ISOVUE-300 IOPAMIDOL (ISOVUE-300) INJECTION 61% COMPARISON:  PET scan on 02/06/2016 and CT of the chest on 01/29/2016 FINDINGS: Cardiovascular: No visualized pulmonary emboli. The thoracic aorta is normal in caliber. The heart size is normal access. There is no evidence of pericardial fluid. Mediastinum/Nodes: Precarinal lymph node measures 13 mm in short axis and shows slight increase in size since the prior PET scan. 11 mm lower right peritracheal lymph node appears stable. Lungs/Pleura: There is a large and loculated right pleural effusion causing drowned lung with very little aeration of the lower lobe and middle lobe. There is some aeration remaining of the upper lobe. Posterior right upper lobe lung mass shows slight increase in size and now measures approximately 4.5 x 7.3 cm (approximately 4.3 x 6.3 cm on the prior chest CT in July). Irregular pleural enhancement is present throughout the hemi thorax consistent with pleural metastatic disease. No overt pulmonary edema. The contralateral left lung demonstrates no infiltrates or masses. Upper Abdomen: Unremarkable. Musculoskeletal: Stable appearance of metastatic lesions involving the inferior tip of the left scapula and the distal aspect of the left seventh  rib. IMPRESSION: Large loculated right pleural effusion with evidence of right sided pleural metastatic disease. The right lower and middle lobes are drowned. Posterior right upper lobe lung mass shows slight increase in size since prior imaging. A precarinal lymph node shows slight increase in size since prior imaging. Electronically Signed   By: GAletta EdouardM.D.   On: 02/08/2016 17:05   [place]orea[place]oracentesis Asp Pleural Space W/img Guide  Result Date: 02/15/2016 INDICATION: Metastatic non-small-cell  carcinoma, right lung mass, dyspnea, recurrent right pleural effusion. Request made for diagnostic and therapeutic right thoracentesis. EXAM: ULTRASOUND GUIDED DIAGNOSTIC AND THERAPEUTIC RIGHT THORACENTESIS MEDICATIONS: None. COMPLICATIONS: None immediate. PROCEDURE: An ultrasound guided thoracentesis was thoroughly discussed with the patient and questions answered. The benefits, risks, alternatives and complications were also discussed. The patient understands and wishes to proceed with the procedure. Written consent was obtained. Ultrasound was performed to localize and mark an adequate pocket of fluid in the right chest. The area was then prepped and draped in the normal sterile fashion. 1% Lidocaine was used for local anesthesia. Under ultrasound guidance a Safe-T-Centesis catheter was introduced. Thoracentesis was performed. The catheter was removed and a dressing applied. FINDINGS: A total of approximately 1.5 liters of bloody fluid was removed. Samples were sent to the laboratory as requested by the clinical team. IMPRESSION: Successful ultrasound guided diagnostic and therapeutic right thoracentesis yielding 1.5 liters of pleural fluid. Admitting physician made aware of above findings. Read by: Rowe Robert, PA-C Electronically Signed   By: Lucrezia Europe M.D.   On: 02/18/2016 16:37        Scheduled Meds: . ceFEPime (MAXIPIME) IV  2 g Intravenous Q8H  . clonazePAM  1 mg Oral QHS  . dorzolamide-timolol   1 drop Both Eyes BID  . vancomycin  750 mg Intravenous Q12H   Continuous Infusions: . sodium chloride 75 mL/hr at 02/28/16 0000     LOS: 2 days     Cordelia Poche, MD [facility] 02/28/2016, 8:38 AM Pager: [phone]  If 7PM-7AM, please contact night-coverage [email or web address] Password [facility] 02/28/2016, 8:38 AM

## 2016-02-28 NOTE — Progress Notes (Signed)
Oncology Nurse Navigator Documentation  Oncology Nurse Navigator Flowsheets 02/28/2016  Navigator Encounter Type Other/I received an inbasket from [place] at [facility].  She was asking if IR can place pleur x cath.  I updated Dr. Julien Nordmann and he stated that is fine.  I called Ryan and updated.  I also call Dr. Teryl Lucy at pager [phone] to update him.    Treatment Phase Pre-Tx/Tx Discussion  Barriers/Navigation Needs Coordination of Care  Interventions Coordination of Care  Coordination of Care Other  Acuity Level 2  Acuity Level 2 Other  Time Spent with Patient 68

## 2016-02-28 NOTE — Consult Note (Signed)
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 Jamse Mead is an 75 y.o. male.  HPI:   The patient is a 75 year old gentleman with Stage IV NSCLC evaluated by Dr. Julien Nordmann who was set up to begin XRT on 8/24 followed by chemotherapy. His CT of the chest on 7/26 had shown a small pleural effusion and the PET on 8/3 showed a moderate right pleural effusion with hypermetabolic pleural-based soft tissue. He had a right thoracentesis on 8/17 showing malignant cells.He had 1.6 L of dark, bloody fluid removed. He was admitted 8/23 with shortness of breath and weakness and a CXR showed a large right pleural effusion. He had another thoracentesis on the same day removing 1.5 L of bloody fluid. A follow up CT of the chest the same day showed a persistent large and loculated right pleural effusion with complete collapse of the middle and lower lobes. There is irregular pleural enhancement throughout the hemithorax. He was sent for another ultrasound guided right thoracentesis today but the ultrasound showed a multi-loculated fluid collection not felt to be amenable to thoracentesis. His Hgb was 14.2 on 8/3 and 12.6 on 8/17 before the first thoracentesis. The Hgb had dropped to 10.1 on admission 8/23 and dropped to 8.3 yesterday am and was stable at 8.3 this am. His WBC was 12.4 on 8/3, 23.3 on 8/17 and 46.7 on admission. It is 48.6 this am. The HCT on the pleural fluid was 28.7 consistent with blood. Venous lactic acid was 4.0 on admission and dropped to 2.6 this am on antibiotics.  His wife is here now talking with me. He is able to talk but is lethargic and somewhat delirious.  Past Medical History:  Diagnosis Date  . Cancer of right lung ([place]) 02/20/2016  .  Glaucoma   . Kidney stones     Past Surgical History:  Procedure Laterality Date  . IR GENERIC HISTORICAL  02/20/2016   IR THORACENTESIS ASP PLEURAL SPACE W/IMG GUIDE 02/20/2016 [facility]-INTERV RAD  . none      Family History  Problem Relation Age of Onset  . Diabetes Paternal Grandmother   . Colon cancer Mother   . Heart Problems Maternal Grandmother     Social History:  reports that he has never smoked. He has never used smokeless tobacco. He reports that he drinks alcohol. He reports that he does not use drugs.  Allergies:  Allergies  Allergen Reactions  . Dye Fdc Red [Red Dye]     Had reaction to dye treatment from kidney stones  . Erythromycin Nausea And Vomiting  . Penicillins Hives    Has patient had a PCN reaction causing immediate rash, facial/tongue/throat swelling, SOB or lightheadedness with hypotension: Yes Has patient had a PCN reaction causing severe rash involving mucus membranes or skin necrosis: no Has patient had a PCN reaction that required hospitalization: no Has patient had a PCN reaction occurring within the last 10 years:no If none of the above answers are "NO", then may proceed with Cephalosporin use.     Medications:  I have reviewed the patient's current medications. Prior to Admission:  Prescriptions Prior to Admission  Medication Sig Dispense Refill  Last Dose  . calcium carbonate (TUMS - DOSED IN MG ELEMENTAL CALCIUM) 500 MG chewable tablet Chew 1 tablet by mouth daily as needed for indigestion or heartburn.   Past Week at Unknown time  . clonazePAM (KLONOPIN) 1 MG tablet Take 1 mg by mouth at bedtime.    02/25/2016 at Unknown time  . Dorzolamide HCl-Timolol Mal PF 22.3-6.8 MG/ML SOLN Apply 22.3 mg to eye 2 (two) times daily. One drop in both eyes.   2 weeks  . HYDROcodone-acetaminophen (NORCO) 7.5-325 MG tablet Take 0.5-1 tablets by mouth every 8 (eight) hours as needed for moderate pain or severe pain.    02/25/2016 at Unknown time  . sildenafil  (REVATIO) 20 MG tablet Take 20 mg by mouth as needed (erectile dysfunction).    unknown at Unknown time  . aspirin EC 81 MG tablet Take 1 tablet (81 mg total) by mouth daily. (Patient not taking: Reported on 02/12/2016) 90 tablet 3 Not Taking at Unknown time  . pravastatin (PRAVACHOL) 20 MG tablet TAKE ONE TABLET BY MOUTH EVERY OTHER DAY (Patient not taking: Reported on 02/22/2016) 45 tablet 0 Not Taking at Unknown time  . Wound Dressings (SONAFINE) Apply 1 application topically daily. To apply to chest area daily after radiation      Scheduled: . ceFEPime (MAXIPIME) IV  2 g Intravenous Q8H  . clonazePAM  1 mg Oral QHS  . dorzolamide-timolol  1 drop Both Eyes BID  . vancomycin  750 mg Intravenous Q12H   Continuous: . sodium chloride 75 mL/hr at 02/28/16 1800   KCM:[id number] **OR** acetaminophen, HYDROcodone-acetaminophen, ondansetron **OR** ondansetron (ZOFRAN) IV Anti-infectives    Start     Dose/Rate Route Frequency Ordered Stop   02/27/16 0200  vancomycin (VANCOCIN) IVPB 750 mg/150 ml premix     750 mg 150 mL/hr over 60 Minutes Intravenous Every 12 hours 02/25/2016 1314     02/16/2016 2200  ceFEPIme (MAXIPIME) 2 g in dextrose 5 % 50 mL IVPB     2 g 100 mL/hr over 30 Minutes Intravenous Every 8 hours 02/06/2016 1314     02/16/2016 1315  vancomycin (VANCOCIN) IVPB 1000 mg/200 mL premix     1,000 mg 200 mL/hr over 60 Minutes Intravenous  Once 02/19/2016 1304 02/23/2016 1502   02/25/2016 1315  ceFEPIme (MAXIPIME) 2 g in dextrose 5 % 50 mL IVPB     2 g 100 mL/hr over 30 Minutes Intravenous  Once 02/05/2016 1310 02/17/2016 1352      Results for orders placed or performed during the hospital encounter of 03/05/2016 (from the past 48 hour(s))  Basic metabolic panel     Status: Abnormal   Collection Time: 02/27/16  3:25 AM  Result Value Ref Range   Sodium 135 135 - 145 mmol/L   Potassium 4.2 3.5 - 5.1 mmol/L    Comment: RESULT REPEATED AND VERIFIED DELTA CHECK NOTED    Chloride 106 101 - 111 mmol/L     CO2 22 22 - 32 mmol/L   Glucose, Bld 141 (H) 65 - 99 mg/dL   BUN 29 (H) 6 - 20 mg/dL   Creatinine, Ser 1.21 0.61 - 1.24 mg/dL   Calcium 8.1 (L) 8.9 - 10.3 mg/dL   GFR calc non Af Amer 57 (L) >60 mL/min   GFR calc Af Amer >60 >60 mL/min    Comment: (NOTE) The eGFR has been calculated using the CKD EPI equation. This calculation has not been validated in all clinical situations. eGFR's persistently <60 mL/min signify  possible Chronic Kidney Disease.    Anion gap 7 5 - 15  CBC     Status: Abnormal   Collection Time: 02/27/16  3:25 AM  Result Value Ref Range   WBC 41.1 (H) 4.0 - 10.5 K/uL   RBC 2.77 (L) 4.22 - 5.81 MIL/uL   Hemoglobin 8.3 (L) 13.0 - 17.0 g/dL   HCT 24.3 (L) 39.0 - 52.0 %   MCV 87.7 78.0 - 100.0 fL   MCH 30.0 26.0 - 34.0 pg   MCHC 34.2 30.0 - 36.0 g/dL   RDW 13.5 11.5 - 15.5 %   Platelets 436 (H) 150 - 400 K/uL  Lactic acid, plasma     Status: Abnormal   Collection Time: 02/27/16 11:03 AM  Result Value Ref Range   Lactic Acid, Venous 2.6 (HH) 0.5 - 1.9 mmol/L    Comment: CREECH,C. RN @1202  ON 8.24.17 BY MCCOY,N.  CBC     Status: Abnormal   Collection Time: 02/28/16  3:17 AM  Result Value Ref Range   WBC 48.6 (H) 4.0 - 10.5 K/uL   RBC 2.83 (L) 4.22 - 5.81 MIL/uL   Hemoglobin 8.3 (L) 13.0 - 17.0 g/dL   HCT 24.4 (L) 39.0 - 52.0 %   MCV 86.2 78.0 - 100.0 fL   MCH 29.3 26.0 - 34.0 pg   MCHC 34.0 30.0 - 36.0 g/dL   RDW 13.6 11.5 - 15.5 %   Platelets 423 (H) 150 - 400 K/uL  Basic metabolic panel     Status: Abnormal   Collection Time: 02/28/16  3:17 AM  Result Value Ref Range   Sodium 135 135 - 145 mmol/L   Potassium 4.7 3.5 - 5.1 mmol/L   Chloride 106 101 - 111 mmol/L   CO2 18 (L) 22 - 32 mmol/L   Glucose, Bld 146 (H) 65 - 99 mg/dL   BUN 32 (H) 6 - 20 mg/dL   Creatinine, Ser 1.17 0.61 - 1.24 mg/dL   Calcium 8.4 (L) 8.9 - 10.3 mg/dL   GFR calc non Af Amer 60 (L) >60 mL/min   GFR calc Af Amer >60 >60 mL/min    Comment: (NOTE) The eGFR has been  calculated using the CKD EPI equation. This calculation has not been validated in all clinical situations. eGFR's persistently <60 mL/min signify possible Chronic Kidney Disease.    Anion gap 11 5 - 15    [place] Chest  Result Date: 02/28/2016 CLINICAL DATA:  RIGHT pleural effusion.  RIGHT lung cancer. EXAM: CHEST ULTRASOUND COMPARISON:  CT 02/28/2016 FINDINGS: There is loculated multi-septated fluid collection within the RIGHT hemi thorax. There is consolidation of the RIGHT lung. The multi loculated pattern is prohibited of productive thoracentesis. IMPRESSION: Multiloculated fluid collection within the RIGHT lower lobe with consolidated lung. The multiloculated pattern is prohibited of productive thoracentesis. Electronically Signed   By: Suzy Bouchard M.D.   On: 02/28/2016 16:47    Review of Systems  Unable to perform ROS: Acuity of condition   Blood pressure (!) 86/66, pulse (!) 110, temperature 97.1 F (36.2 C), temperature source Axillary, resp. rate (!) 28, height 5' 8"  (1.727 m), weight 77.6 kg (171 lb 1.2 oz), SpO2 98 %. Physical Exam  Constitutional:  Acutely ill-appearing gentleman with increased work of breathing.  HENT:  Head: Normocephalic and atraumatic.  Eyes: EOM are normal. Pupils are equal, round, and reactive to light.  Cardiovascular: Normal rate, regular rhythm and normal heart sounds.   No murmur heard. Respiratory:  Decreased breath  sounds over the right lung  GI: Soft. Bowel sounds are normal. He exhibits no distension. There is no tenderness.  Neurological:  Awake and talking but not focused and appears delirious.  Skin: Skin is warm and dry.   CLINICAL DATA:  Non-small-cell lung carcinoma metastatic to the pleural space with malignant pleural effusion. Hypermetabolic lymph node metastases also identified by PET scan. Clinical deterioration and leukocytosis.  EXAM: CT CHEST WITH CONTRAST  TECHNIQUE: Multidetector CT imaging of the chest was  performed during intravenous contrast administration.  CONTRAST:  20m ISOVUE-300 IOPAMIDOL (ISOVUE-300) INJECTION 61%  COMPARISON:  PET scan on 02/06/2016 and CT of the chest on 01/29/2016  FINDINGS: Cardiovascular: No visualized pulmonary emboli. The thoracic aorta is normal in caliber. The heart size is normal access. There is no evidence of pericardial fluid.  Mediastinum/Nodes: Precarinal lymph node measures 13 mm in short axis and shows slight increase in size since the prior PET scan. 11 mm lower right peritracheal lymph node appears stable.  Lungs/Pleura: There is a large and loculated right pleural effusion causing drowned lung with very little aeration of the lower lobe and middle lobe. There is some aeration remaining of the upper lobe. Posterior right upper lobe lung mass shows slight increase in size and now measures approximately 4.5 x 7.3 cm (approximately 4.3 x 6.3 cm on the prior chest CT in July). Irregular pleural enhancement is present throughout the hemi thorax consistent with pleural metastatic disease.  No overt pulmonary edema. The contralateral left lung demonstrates no infiltrates or masses.  Upper Abdomen: Unremarkable.  Musculoskeletal: Stable appearance of metastatic lesions involving the inferior tip of the left scapula and the distal aspect of the left seventh rib.  IMPRESSION: Large loculated right pleural effusion with evidence of right sided pleural metastatic disease. The right lower and middle lobes are drowned. Posterior right upper lobe lung mass shows slight increase in size since prior imaging. A precarinal lymph node shows slight increase in size since prior imaging.   Electronically Signed   By: GAletta EdouardM.D.   On: 02/17/2016 17:05  Assessment/Plan:  He has stage IV NSCLC with a bloody malignant right pleural effusion that was treated with two thoracenteses. Sometime in the past three weeks he has dropped  his Hgb from 14 to 8.3 and must have bled into the right pleural space resulting in a complex multi-loculated hemothorax. This type of effusion is not amenable to thoracentesis or chest tube drainage. Just putting a chest tube through the chest wall and pleura may result in more bleeding. Thoracoscopy or thoracotomy are really the only procedures that allow complete drainage and I don't think he is a candidate for general anesthesia and surgery. Even if the hemothorax is drained it will likely recur and this type of problem is usually a terminal condition. I could insert a chest tube at the bedside but it may not drain much and may result in more bleeding, more pain, or infection. I would recommend comfort care and end of life planning for this patient. I discussed all of this with his wife and she understands the gravity of this situation. She does not want a chest tube inserted at this time. If she changes her mind and you feel that a chest tube is in his best interest I can do that.  BGaye Pollack8/25/2017, 7:34 PM

## 2016-02-29 ENCOUNTER — Other Ambulatory Visit: Payer: Self-pay

## 2016-02-29 DIAGNOSIS — N289 Disorder of kidney and ureter, unspecified: Secondary | ICD-10-CM

## 2016-02-29 DIAGNOSIS — J942 Hemothorax: Principal | ICD-10-CM

## 2016-02-29 DIAGNOSIS — R0600 Dyspnea, unspecified: Secondary | ICD-10-CM

## 2016-02-29 DIAGNOSIS — J90 Pleural effusion, not elsewhere classified: Secondary | ICD-10-CM

## 2016-02-29 LAB — CBC WITH DIFFERENTIAL/PLATELET
BASOS PCT: 0 %
Basophils Absolute: 0 10*3/uL (ref 0.0–0.1)
EOS ABS: 1 10*3/uL — AB (ref 0.0–0.7)
EOS PCT: 2 %
HCT: 22 % — ABNORMAL LOW (ref 39.0–52.0)
Hemoglobin: 7.1 g/dL — ABNORMAL LOW (ref 13.0–17.0)
LYMPHS ABS: 2 10*3/uL (ref 0.7–4.0)
Lymphocytes Relative: 4 %
MCH: 28.4 pg (ref 26.0–34.0)
MCHC: 32.3 g/dL (ref 30.0–36.0)
MCV: 88 fL (ref 78.0–100.0)
MONO ABS: 3.5 10*3/uL — AB (ref 0.1–1.0)
Monocytes Relative: 7 %
NEUTROS ABS: 43.7 10*3/uL — AB (ref 1.7–7.7)
Neutrophils Relative %: 87 %
PLATELETS: 496 10*3/uL — AB (ref 150–400)
RBC: 2.5 MIL/uL — ABNORMAL LOW (ref 4.22–5.81)
RDW: 14 % (ref 11.5–15.5)
WBC: 50.2 10*3/uL (ref 4.0–10.5)

## 2016-02-29 MED ORDER — MORPHINE BOLUS VIA INFUSION
1.0000 mg | INTRAVENOUS | Status: DC | PRN
  Filled 2016-02-29: qty 4

## 2016-02-29 MED ORDER — HALOPERIDOL LACTATE 5 MG/ML IJ SOLN
5.0000 mg | Freq: Once | INTRAMUSCULAR | Status: AC
  Administered 2016-02-29: 5 mg via INTRAVENOUS
  Filled 2016-02-29: qty 1

## 2016-02-29 MED ORDER — AMIODARONE HCL IN DEXTROSE 360-4.14 MG/200ML-% IV SOLN: 30.0000 mg/h | INTRAVENOUS | Status: DC

## 2016-02-29 MED ORDER — SODIUM CHLORIDE 0.9 % IV BOLUS (SEPSIS)
1000.0000 mL | Freq: Once | INTRAVENOUS | Status: AC
  Administered 2016-02-29: 1000 mL via INTRAVENOUS

## 2016-02-29 MED ORDER — AMIODARONE LOAD VIA INFUSION
150.0000 mg | Freq: Once | INTRAVENOUS | Status: AC
  Administered 2016-02-29: 150 mg via INTRAVENOUS
  Filled 2016-02-29: qty 83.34

## 2016-02-29 MED ORDER — AMIODARONE HCL IN DEXTROSE 360-4.14 MG/200ML-% IV SOLN
60.0000 mg/h | INTRAVENOUS | Status: DC
  Administered 2016-02-29: 60 mg/h via INTRAVENOUS
  Filled 2016-02-29 (×2): qty 200

## 2016-02-29 MED ORDER — MORPHINE SULFATE 25 MG/ML IV SOLN
1.0000 mg/h | INTRAVENOUS | Status: DC
  Administered 2016-02-29: 2 mg/h via INTRAVENOUS
  Filled 2016-02-29: qty 10

## 2016-03-02 ENCOUNTER — Ambulatory Visit: Payer: Medicare Other | Admitting: Radiation Oncology

## 2016-03-02 ENCOUNTER — Ambulatory Visit: Payer: Medicare Other

## 2016-03-02 LAB — GLUCOSE, CAPILLARY: Glucose-Capillary: 154 mg/dL — ABNORMAL HIGH (ref 65–99)

## 2016-03-03 ENCOUNTER — Ambulatory Visit: Payer: Medicare Other

## 2016-03-04 ENCOUNTER — Encounter: Payer: Self-pay | Admitting: Radiation Oncology

## 2016-03-04 ENCOUNTER — Encounter: Payer: Self-pay | Admitting: *Deleted

## 2016-03-04 ENCOUNTER — Ambulatory Visit: Payer: Medicare Other

## 2016-03-04 LAB — CULTURE, BLOOD (ROUTINE X 2)
CULTURE: NO GROWTH
Culture: NO GROWTH

## 2016-03-04 LAB — CULTURE, BODY FLUID-BOTTLE: CULTURE: NO GROWTH

## 2016-03-04 LAB — CULTURE, BODY FLUID W GRAM STAIN -BOTTLE

## 2016-03-04 NOTE — Progress Notes (Signed)
Oncology Nurse Navigator Documentation  Oncology Nurse Navigator Flowsheets 03/04/2016  Navigator Encounter Type Other/per Dr. Julien Nordmann, I called [facility] to cancel testing.  I called and spoke with [facility] and he cancelled and stated ok to cancel due to results not out yet.  Dr. Julien Nordmann updated.   Treatment Phase Other  Coordination of Care Other  Acuity Level 1  Acuity Level 2 Other  Time Spent with Patient 15

## 2016-03-04 NOTE — Progress Notes (Incomplete)
°  Radiation Oncology         [phone] ________________________________  Name: Wesley Coleman MRN: [id number]  Date: 03/04/2016  DOB: 01/26/1941  End of Treatment Note  Diagnosis: Stage Iv, T2b, N3, M1b NSCLC, favor adenocarcinoma of the right lung  Indication for treatment:  curative     Radiation treatment dates:   02/27/2016  Site/dose:   Right lung // 30 Gy with 10 fractions at a dose of 3 Gy/ fraction  Beams/energy:   3D // 10 X  Narrative: The patient status declined and treatment was stopped.   Plan: No further follow up.  ------------------------------------------------  Jodelle Gross, MD, PhD  This document serves as a record of services personally performed by Kyung Rudd, MD. It was created on his behalf by Bethann Humble, a trained medical scribe. The creation of this record is based on the scribe's personal observations and the provider's statements to them. This document has been checked and approved by the attending provider.

## 2016-03-05 ENCOUNTER — Ambulatory Visit: Payer: Medicare Other

## 2016-03-06 ENCOUNTER — Ambulatory Visit: Payer: Medicare Other

## 2016-03-06 NOTE — Progress Notes (Signed)
Dr. Alcario Drought responded and said that he would not be contacting the wife. He stated that he did not know the patient. Told to call Dr. Halford Chessman back. Dr. Halford Chessman was called again. He contacted the wife. Wife said that she is coming in, per Dr. Halford Chessman. Will continue to monitor.

## 2016-03-06 NOTE — Progress Notes (Signed)
[facility] Progress Note Patient Name: Wesley Coleman DOB: 02-27-1941 MRN: [id number]   Date of Service  March 28, 2016  HPI/Events of Note  A fib with RVR and hypotension.   eICU Interventions  Will give fluid bolus and start amiodarone.      Intervention Category Major Interventions: Other:  Nohelani Benning 03/28/2016, 1:10 AM

## 2016-03-06 NOTE — Progress Notes (Signed)
Had discussion with family.  Discussed what a code was, and the very unlikely chance of survival of a patient in this condition (worsening hemo thorax, declining condition during hospital stay, lung cancer).  Ultimately concluded that we would just be adding to patients suffering with CPR breaking his ribs.  Patient's family have elected to proceed to focus on comfort measures only on this patient, code status now DNR.  I anticipate a likely in-hospital death, if not then get pal care consult.

## 2016-03-06 NOTE — Progress Notes (Signed)
Dr. Jonette Eva contacted and told that PCCM needs someone to contact wife regarding code status due to possibility of code. Jonette Eva referred to Dr. Alcario Drought and stated that she is at [facility] at this time and is unable to lay eyes on the patient. Dr. Alcario Drought paged, waiting for response.

## 2016-03-06 NOTE — Progress Notes (Signed)
Dr. Halford Chessman at [facility] contacted about low blood pressure. Nothing ordered. Jonette Eva was then contacted and ordered another 1 L bolus. Will continue to monitor.

## 2016-03-06 NOTE — Discharge Summary (Signed)
Wesley Coleman SWN:[id number] DOB: Sep 15, 1940   PCP: Mathews Argyle, MD  Admit date: Mar 08, 2016 Date of Death: 03/11/2016 Time of Death: 12:17PM Notification: Mathews Argyle, MD notified of death of 03-11-16   History of present illness:  Wesley Coleman is a 75 y.o. male with a history of Stage 4 non-small cell lung cancer Prentice Docker presented with complaint of dyspnea Wesley Coleman did not improve after initial thoracentesis. Cardiothoracic surgery was consulted and, after discussion with family, decided patient was too decompensated to undergo chest tube placement and was made comfort care.   Final Diagnoses:  1.   Principal Problem:   Hemothorax Active Problems:   Mixed hyperlipidemia   Lung mass   Malignant neoplasm of upper lobe of right lung (HCC)   Shortness of breath   Chest wall pain   Respiratory failure (HCC)   Metastatic cancer (HCC)   Leukocytosis   AKI (acute kidney injury) ([place])    The results of significant diagnostics from this hospitalization (including imaging, microbiology, ancillary and laboratory) are listed below for reference.    Significant Diagnostic Studies: Dg Chest 1 View  Result Date: 02/20/2016 CLINICAL DATA:  History of lung carcinoma, rib right thoracentesis EXAM: CHEST 1 VIEW COMPARISON:  Chest x-ray of 01/23/2016, and CT chest of 01/29/2016 FINDINGS: After right thoracentesis, some the right pleural effusion has been evacuated. The masslike opacity noted by chest x-ray in the right infrahilar region is not as well seen, being partially obscured by fluid and volume loss. Probable loculated fluid is noted creating haziness in the right upper hemi thorax. No pneumothorax is seen. The left lung is clear. IMPRESSION: 1. No pneumothorax after right thoracentesis. 2. Right pleural effusion. The right infrahilar mass is not well seen. Electronically Signed   By: Ivar Drape M.D.   On: 02/20/2016 10:43   Dg Chest 2  View  Result Date: March 08, 2016 CLINICAL DATA:  Right pleural effusion. EXAM: CHEST  2 VIEW COMPARISON:  Radiograph of February 20, 2016. FINDINGS: Stable cardiomediastinal silhouette. Left lung is clear. No pneumothorax is noted. Large right pleural effusion is noted which is significantly enlarged compared to prior exam. Rounded pleural-based mass is noted in right lung apex concerning for neoplasm or metastatic disease. Bony thorax is unremarkable. IMPRESSION: Large right pleural effusion is noted which is significantly enlarged compared to prior exam. Rounded pleural-based mass seen in right lung apex consistent with malignancy or metastatic disease. Electronically Signed   By: Marijo Conception, M.D.   On: 03/08/2016 14:05   Ct Chest W Contrast  Result Date: 03-08-2016 CLINICAL DATA:  Non-small-cell lung carcinoma metastatic to the pleural space with malignant pleural effusion. Hypermetabolic lymph node metastases also identified by PET scan. Clinical deterioration and leukocytosis. EXAM: CT CHEST WITH CONTRAST TECHNIQUE: Multidetector CT imaging of the chest was performed during intravenous contrast administration. CONTRAST:  32m ISOVUE-300 IOPAMIDOL (ISOVUE-300) INJECTION 61% COMPARISON:  PET scan on 02/06/2016 and CT of the chest on 01/29/2016 FINDINGS: Cardiovascular: No visualized pulmonary emboli. The thoracic aorta is normal in caliber. The heart size is normal access. There is no evidence of pericardial fluid. Mediastinum/Nodes: Precarinal lymph node measures 13 mm in short axis and shows slight increase in size since the prior PET scan. 11 mm lower right peritracheal lymph node appears stable. Lungs/Pleura: There is a large and loculated right pleural effusion causing drowned lung with very little aeration of the lower lobe and middle lobe. There is some aeration remaining of the upper lobe.  Posterior right upper lobe lung mass shows slight increase in size and now measures approximately 4.5 x 7.3 cm  (approximately 4.3 x 6.3 cm on the prior chest CT in July). Irregular pleural enhancement is present throughout the hemi thorax consistent with pleural metastatic disease. No overt pulmonary edema. The contralateral left lung demonstrates no infiltrates or masses. Upper Abdomen: Unremarkable. Musculoskeletal: Stable appearance of metastatic lesions involving the inferior tip of the left scapula and the distal aspect of the left seventh rib. IMPRESSION: Large loculated right pleural effusion with evidence of right sided pleural metastatic disease. The right lower and middle lobes are drowned. Posterior right upper lobe lung mass shows slight increase in size since prior imaging. A precarinal lymph node shows slight increase in size since prior imaging. Electronically Signed   By: Aletta Edouard M.D.   On: 02/15/2016 17:05   Mr Jeri Cos SW Contrast  Result Date: 02/08/2016 CLINICAL DATA:  Right upper lobe lung cancer. Initial staging. Hearing loss. History of prostate cancer. EXAM: MRI HEAD WITHOUT AND WITH CONTRAST TECHNIQUE: Multiplanar, multiecho pulse sequences of the brain and surrounding structures were obtained without and with intravenous contrast. CONTRAST:  94m MULTIHANCE GADOBENATE DIMEGLUMINE 529 MG/ML IV SOLN COMPARISON:  None. FINDINGS: There is no evidence of acute infarct, intracranial hemorrhage, mass, midline shift, or extra-axial fluid collection. Mild cerebral atrophy is within normal limits for age. Patchy T2 hyperintensities are present throughout the cerebral white matter, confluent in the periatrial regions and nonspecific but compatible with extensive chronic small vessel ischemic disease. 4.3 x 1.6 cm extra-axial CSF space in the middle cranial fossa is consistent with an incidental arachnoid cyst resulting in mild flattening of the temporal pole. No abnormal enhancement is identified. No suspicious skull lesions are seen. Orbits are unremarkable. A 5 mm cystic focus at the inferomedial  aspect of the right external auditory canal is benign in appearance. No significant inflammatory disease is seen in the paranasal sinuses are mastoid air cells. Major intracranial vascular flow voids are preserved. IMPRESSION: 1. No evidence of intracranial metastases. 2. Extensive chronic small vessel ischemic disease. Electronically Signed   By: ALogan BoresM.D.   On: 02/08/2016 09:11   [place]hest  Result Date: 02/28/2016 CLINICAL DATA:  RIGHT pleural effusion.  RIGHT lung cancer. EXAM: CHEST ULTRASOUND COMPARISON:  CT 02/27/2016 FINDINGS: There is loculated multi-septated fluid collection within the RIGHT hemi thorax. There is consolidation of the RIGHT lung. The multi loculated pattern is prohibited of productive thoracentesis. IMPRESSION: Multiloculated fluid collection within the RIGHT lower lobe with consolidated lung. The multiloculated pattern is prohibited of productive thoracentesis. Electronically Signed   By: SSuzy BouchardM.D.   On: 02/28/2016 16:47   Nm Pet Image Initial (pi) Skull Base To Thigh  Result Date: 02/06/2016 CLINICAL DATA:  Initial treatment strategy for right lung mass. EXAM: NUCLEAR MEDICINE PET SKULL BASE TO THIGH TECHNIQUE: 8.69 mCi F-18 FDG was injected intravenously. Full-ring PET imaging was performed from the skull base to thigh after the radiotracer. CT data was obtained and used for attenuation correction and anatomic localization. FASTING BLOOD GLUCOSE:  Value: 114 mg/dl COMPARISON:  CT chest dated 01/29/2016. CT abdomen pelvis dated 12/20/2011. FINDINGS: NECK Right posterior cervical lymph nodes measuring up to 7 mm short axis (series 4/ image 25), max SUV 9.7, suspicious. Mild pharyngeal hypermetabolism, likely reactive. 1.4 cm right thyroid nodule (series 4/ image 46), max SUV 12.3. CHEST 4.7 x 6.4 cm posterior right upper lobe mass (series 7/image 22), max SUV  63.6, compatible with primary bronchogenic neoplasm. Thoracic lymphadenopathy, including: --Right  paratracheal lymphadenopathy measuring up to 12 mm short axis (series 4/ image 63), max SUV 15.3 --13 mm short axis right hilar node (series 4/ image 68), max SUV 24.8 Abnormal pleural-based soft tissue in the right hemithorax. For example, 4.3 x 0.9 cm lesion medially (series 4/ image 63), max SUV 33.8. Additional abnormal soft tissue with hypermetabolism along the right anterior hemidiaphragm (series 4/image 101), max SUV 24.5. Associated moderate right pleural effusion. ABDOMEN/PELVIS No abnormal hypermetabolic activity within the liver, pancreas, adrenal glands, or spleen. Numerous bilateral nonobstructing renal calculi, measuring up to 9 mm in the right lower pole (series 4/image 124). No hydronephrosis. No hypermetabolic lymph nodes in the abdomen or pelvis. SKELETON Osseous metastases, including: --Left inferior scapula, max SUV 16.9 --Left anterior 7th rib, max SUV 43.5 IMPRESSION: 4.7 x 6.4 cm posterior right upper lobe mass, compatible primary bronchogenic neoplasm. Associated right hilar, mediastinal, and right posterior cervical nodal metastases. Pleural-based metastases in the right hemithorax, as above. Moderate right pleural effusion. Left scapular and 7th rib osseous metastases, as above. Hypermetabolic 1.4 cm right thyroid nodule. Hypermetabolic thyroid nodules demonstrate approximately 30-40% likelihood of thyroid malignancy. Metastasis is also possible. Consider thyroid ultrasound with sampling if clinically warranted in this patient. Electronically Signed   By: Julian Hy M.D.   On: 02/06/2016 09:21   Ir Thoracentesis Asp Pleural Space W/img Guide  Result Date: 02/20/2016 INDICATION: Patient with history of right lung mass, right pleural effusion. Request made for diagnostic and therapeutic right thoracentesis. EXAM: ULTRASOUND GUIDED DIAGNOSTIC AND THERAPEUTIC RIGHT THORACENTESIS MEDICATIONS: None. COMPLICATIONS: None immediate. PROCEDURE: An ultrasound guided thoracentesis was  thoroughly discussed with the patient and questions answered. The benefits, risks, alternatives and complications were also discussed. The patient understands and wishes to proceed with the procedure. Written consent was obtained. Ultrasound was performed to localize and mark an adequate pocket of fluid in the right chest. The area was then prepped and draped in the normal sterile fashion. 1% Lidocaine was used for local anesthesia. Under ultrasound guidance a Safe-T-Centesis catheter was introduced. Thoracentesis was performed. The catheter was removed and a dressing applied. FINDINGS: A total of approximately 1.6 liters of dark, bloody fluid was removed. Samples were sent to the laboratory as requested by the clinical team. IMPRESSION: Successful ultrasound guided diagnostic and therapeutic right thoracentesis yielding 1.6 liters of pleural fluid. Read by: Rowe Robert, PA-C Electronically Signed   By: Jerilynn Mages.  Shick M.D.   On: 02/20/2016 12:59   US Thoracentesis Asp Pleural Space W/img Guide  Result Date: 02/06/2016 INDICATION: Metastatic non-small-cell carcinoma, right lung mass, dyspnea, recurrent right pleural effusion. Request made for diagnostic and therapeutic right thoracentesis. EXAM: ULTRASOUND GUIDED DIAGNOSTIC AND THERAPEUTIC RIGHT THORACENTESIS MEDICATIONS: None. COMPLICATIONS: None immediate. PROCEDURE: An ultrasound guided thoracentesis was thoroughly discussed with the patient and questions answered. The benefits, risks, alternatives and complications were also discussed. The patient understands and wishes to proceed with the procedure. Written consent was obtained. Ultrasound was performed to localize and mark an adequate pocket of fluid in the right chest. The area was then prepped and draped in the normal sterile fashion. 1% Lidocaine was used for local anesthesia. Under ultrasound guidance a Safe-T-Centesis catheter was introduced. Thoracentesis was performed. The catheter was removed and a  dressing applied. FINDINGS: A total of approximately 1.5 liters of bloody fluid was removed. Samples were sent to the laboratory as requested by the clinical team. IMPRESSION: Successful ultrasound guided diagnostic and therapeutic  right thoracentesis yielding 1.5 liters of pleural fluid. Admitting physician made aware of above findings. Read by: Rowe Robert, PA-C Electronically Signed   By: Lucrezia Europe M.D.   On: 02/09/2016 16:37    Microbiology: Recent Results (from the past 240 hour(s))  Culture, blood (routine x 2)     Status: None (Preliminary result)   Collection Time: 02/24/2016 12:30 PM  Result Value Ref Range Status   Specimen Description BLOOD LEFT ANTECUBITAL  Final   Special Requests BOTTLES DRAWN AEROBIC AND ANAEROBIC 5ML  Final   Culture   Final    NO GROWTH 3 DAYS Performed at [facility]    Report Status PENDING  Incomplete  Culture, blood (routine x 2)     Status: None (Preliminary result)   Collection Time: 02/07/2016  1:00 PM  Result Value Ref Range Status   Specimen Description BLOOD LEFT HAND  Final   Special Requests BOTTLES DRAWN AEROBIC AND ANAEROBIC 5CC  Final   Culture   Final    NO GROWTH 3 DAYS Performed at [facility]    Report Status PENDING  Incomplete  Culture, body fluid-bottle     Status: None (Preliminary result)   Collection Time: 02/25/2016  3:30 PM  Result Value Ref Range Status   Specimen Description FLUID RIGHT PLEURAL  Final   Special Requests BOTTLES DRAWN AEROBIC AND ANAEROBIC 10CC  Final   Culture   Final    NO GROWTH 3 DAYS Performed at [facility]    Report Status PENDING  Incomplete  Gram stain     Status: None   Collection Time: 02/25/2016  3:30 PM  Result Value Ref Range Status   Specimen Description FLUID RIGHT PLEURAL  Final   Special Requests NONE  Final   Gram Stain   Final    ABUNDANT WBC PRESENT, PREDOMINANTLY PMN NO ORGANISMS SEEN Performed at [facility]    Report Status 03/05/2016 FINAL   Final  MRSA PCR Screening     Status: None   Collection Time: 02/09/2016  5:40 PM  Result Value Ref Range Status   MRSA by PCR NEGATIVE NEGATIVE Final    Comment:        The GeneXpert MRSA Assay (FDA approved for NASAL specimens only), is one component of a comprehensive MRSA colonization surveillance program. It is not intended to diagnose MRSA infection nor to guide or monitor treatment for MRSA infections.   Urine culture     Status: None   Collection Time: 03/04/2016  5:46 PM  Result Value Ref Range Status   Specimen Description URINE, CLEAN CATCH  Final   Special Requests NONE  Final   Culture NO GROWTH Performed at [facility]   Final   Report Status 02/28/2016 FINAL  Final     Labs: Basic Metabolic Panel:  Recent Labs Lab 02/13/2016 1010 02/27/16 0325 02/28/16 0317  NA 132* 135 135  K 5.1 4.2 4.7  CL 97* 106 106  CO2 22 22 18*  GLUCOSE 153* 141* 146*  BUN 30* 29* 32*  CREATININE 1.40* 1.21 1.17  CALCIUM 8.8* 8.1* 8.4*   Liver Function Tests:  Recent Labs Lab 02/14/2016 1010  AST 42*  ALT 39  ALKPHOS 208*  BILITOT 2.3*  PROT 7.2  ALBUMIN 1.9*   No results for input(s): LIPASE, AMYLASE in the last 168 hours. No results for input(s): AMMONIA in the last 168 hours. CBC:  Recent Labs Lab 02/11/2016 1010 02/17/2016 1552 02/27/16  3887 02/28/16 0317 03/17/16 0324  WBC 46.7*  --  41.1* 48.6* 50.2*  NEUTROABS 40.2*  --   --   --  43.7*  HGB 10.1*  --  8.3* 8.3* 7.1*  HCT 29.7* 28.7* 24.3* 24.4* 22.0*  MCV 87.1  --  87.7 86.2 88.0  PLT 481*  --  436* 423* 496*   Cardiac Enzymes:  Recent Labs Lab 02/19/2016 1010  TROPONINI <0.03   D-Dimer No results for input(s): DDIMER in the last 72 hours. BNP: Invalid input(s): POCBNP CBG: No results for input(s): GLUCAP in the last 168 hours. Anemia work up No results for input(s): VITAMINB12, FOLATE, FERRITIN, TIBC, IRON, RETICCTPCT in the last 72 hours. Urinalysis    Component Value Date/Time    COLORURINE ORANGE (A) 02/19/2016 1746   APPEARANCEUR CLOUDY (A) 02/04/2016 1746   LABSPEC 1.025 02/18/2016 1746   PHURINE 5.0 02/25/2016 1746   GLUCOSEU NEGATIVE 02/21/2016 1746   HGBUR NEGATIVE 02/04/2016 1746   BILIRUBINUR SMALL (A) 03/01/2016 1746   KETONESUR NEGATIVE 02/19/2016 1746   PROTEINUR NEGATIVE 02/25/2016 1746   UROBILINOGEN 0.2 12/20/2011 0956   NITRITE POSITIVE (A) 03/05/2016 1746   LEUKOCYTESUR TRACE (A) 02/18/2016 1746   Sepsis Labs Invalid input(s): PROCALCITONIN,  WBC,  LACTICIDVEN     SIGNED:  Cordelia Poche, MD [facility] Pager [phone]  If 7PM-7AM, please contact night-coverage [email or web address] Password [email or web address] March 17, 2016, 9:01 PM

## 2016-03-06 NOTE — Progress Notes (Signed)
Chaplain paged to come and provide spiritual support to Wesley Coleman and his family. Wesley Coleman is a practicing Wesley Coleman who is rapidly dying of cancer. His family has authorized his code status from full measures to DNR/AND. Although this was a difficult decision, the family is at peace spiritually with the decision, not wishing him to be subject to the physically punishing effects of CPR in his weakened condition. The family was encouraged to talk with Wesley Coleman so that he can hear there voices and be comforted by the fact he is not alone in his last hours before death.  I prayed for Wesley Coleman and provided grief support and counsel.  Page the chaplain immediately should this family need or request further spiritual care prior to or after pt death.  Sallee Lange. Marie Chow, DMin, MDiv, MA Night and Weekend Chaplain

## 2016-03-06 NOTE — Progress Notes (Signed)
Wasted 240 ml of morphine (1 mg/ml). Wasted in sink and witnessed by Jacklynn Lewis, RN.

## 2016-03-06 DEATH — deceased

## 2016-03-10 ENCOUNTER — Ambulatory Visit: Payer: Medicare Other

## 2016-03-11 ENCOUNTER — Ambulatory Visit: Payer: Medicare Other

## 2016-03-12 ENCOUNTER — Ambulatory Visit: Payer: Medicare Other

## 2016-03-12 ENCOUNTER — Encounter (HOSPITAL_COMMUNITY): Payer: Self-pay

## 2016-03-13 ENCOUNTER — Ambulatory Visit: Payer: Medicare Other

## 2016-03-13 ENCOUNTER — Encounter (HOSPITAL_COMMUNITY): Payer: Self-pay

## 2016-03-16 ENCOUNTER — Ambulatory Visit: Payer: Medicare Other

## 2017-07-09 IMAGING — MR MR HEAD WO/W CM
10 of 14 series · 33 of 48 positions shown · IV contrast (multihance)
Comparison: None.

CLINICAL DATA: Right upper lobe lung cancer. Initial staging.
Hearing loss. History of prostate cancer.

EXAM:
MRI HEAD WITHOUT AND WITH CONTRAST
TECHNIQUE: Multiplanar, multiecho pulse sequences of the brain and surrounding
structures were obtained without and with intravenous contrast.
CONTRAST:  16mL MULTIHANCE GADOBENATE DIMEGLUMINE 529 MG/ML IV SOLN

[Series 4: DWI · axial · 3.0mm · 1.09mm/px · z∈[-96,+71]mm · 8 of 114 slices shown (1 of 4)]
[im 1/114]
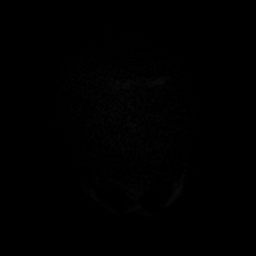
[im 13/114]
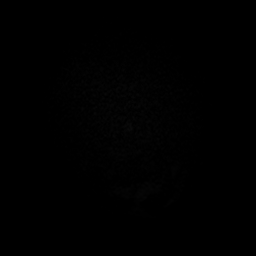
[im 38/114]
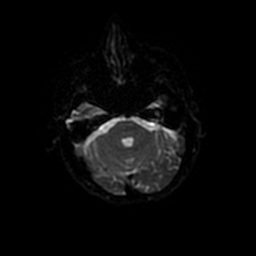
[im 51/114]
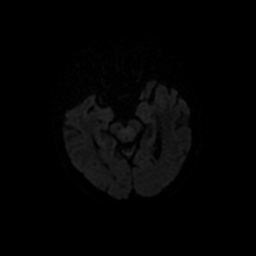
[im 63/114]
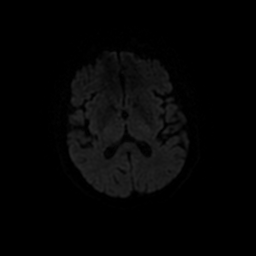
[im 76/114]
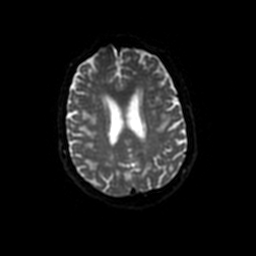
[im 101/114]
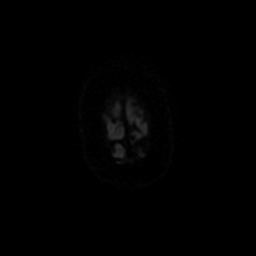
[im 114/114]
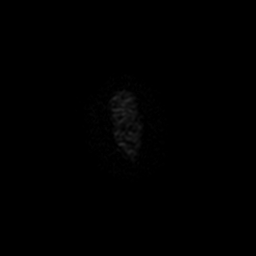

[Series 5: DWI · coronal · 5.0mm · 1.09mm/px · 6 of 76 slices shown (2 of 4)]
[im 1/76]
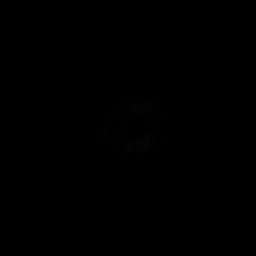
[im 16/76]
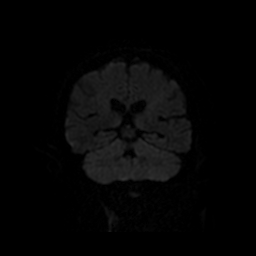
[im 31/76]
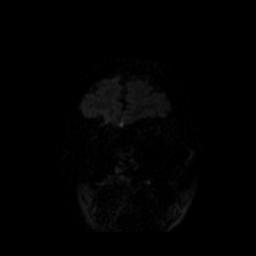
[im 46/76]
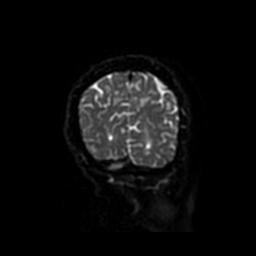
[im 61/76]
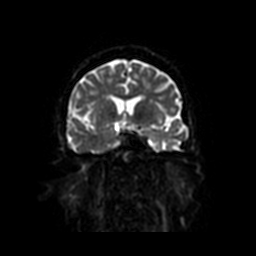
[im 76/76]
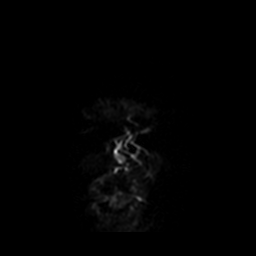

[Series 6: T2 · axial · 5.0mm · 0.43mm/px · z∈[-97,+65]mm · 2 of 26 slices shown]
[im 1/26]
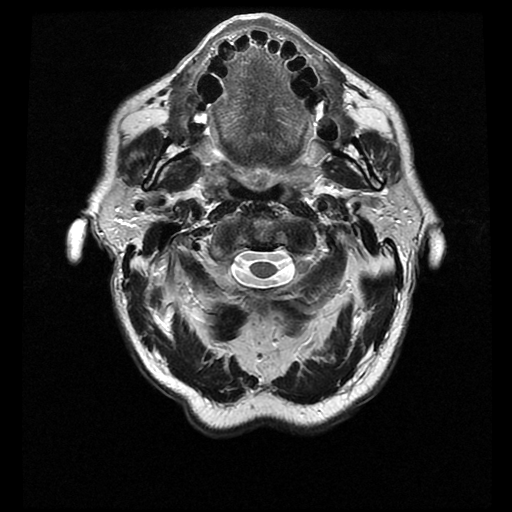
[im 26/26]
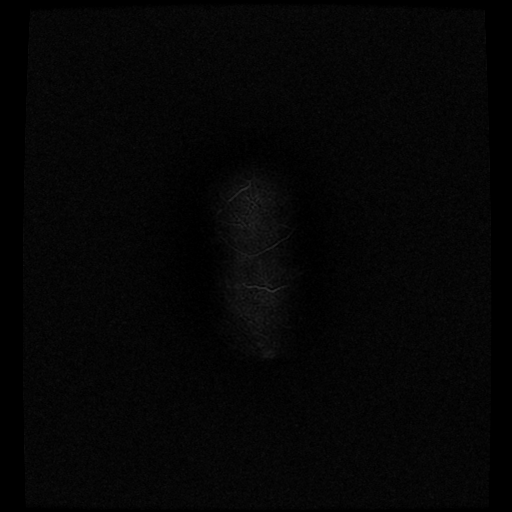

[Series 7: FLAIR · axial · 5.0mm · 0.43mm/px · z∈[-103,+71]mm · 2 of 26 slices shown (1 of 2)]
[im 1/26]
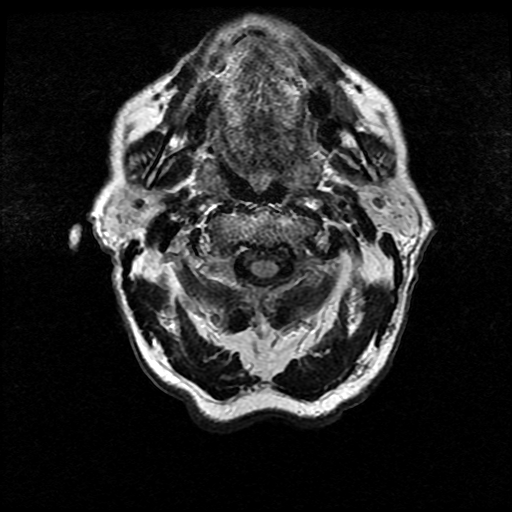
[im 26/26]
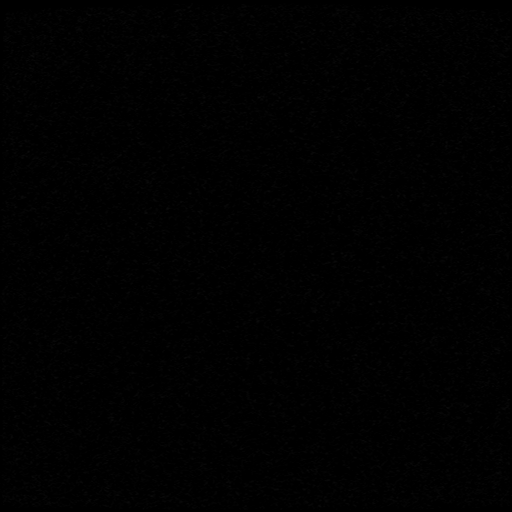

[Series 8: FLAIR · sagittal · 5.0mm · 0.47mm/px · 2 of 27 slices shown (2 of 2)]
[im 1/27]
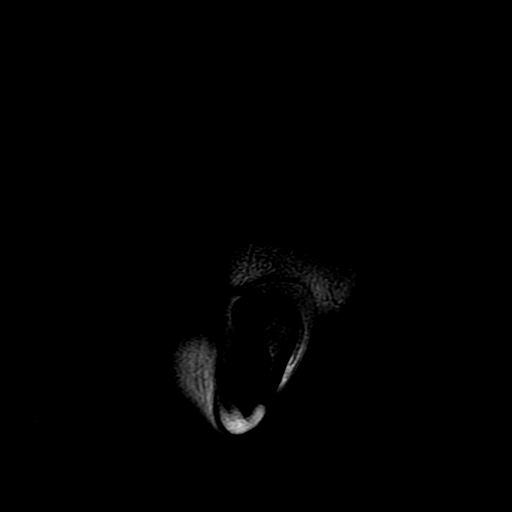
[im 27/27]
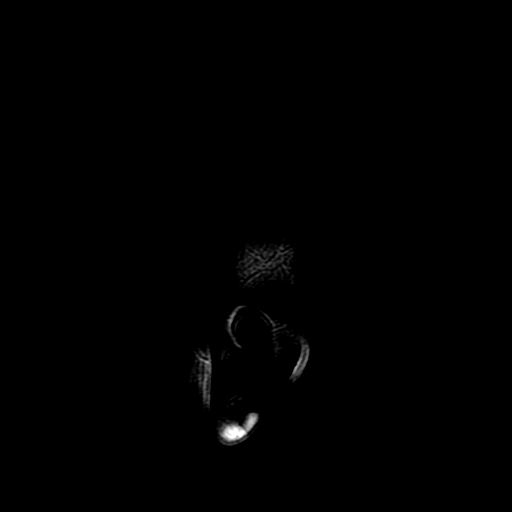

[Series 11: T2 post-contrast · coronal · 5.0mm · 0.45mm/px · 2 of 31 slices shown]
[im 1/31]
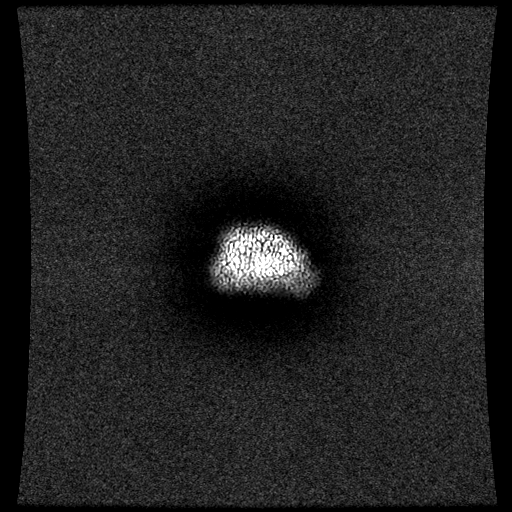
[im 31/31]
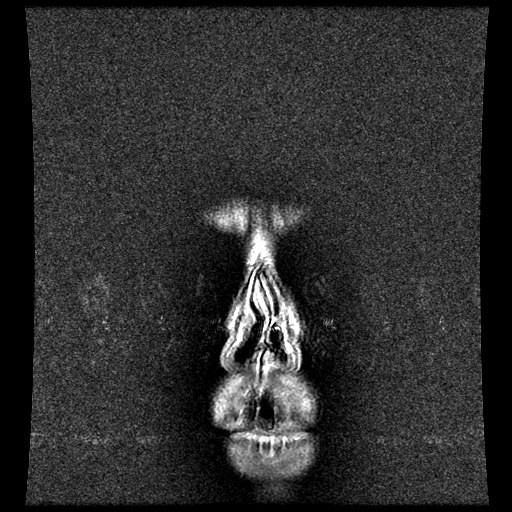

[Series 13: T1 post-contrast · coronal · 5.0mm · 0.45mm/px · 2 of 31 slices shown (1 of 2)]
[im 1/31]
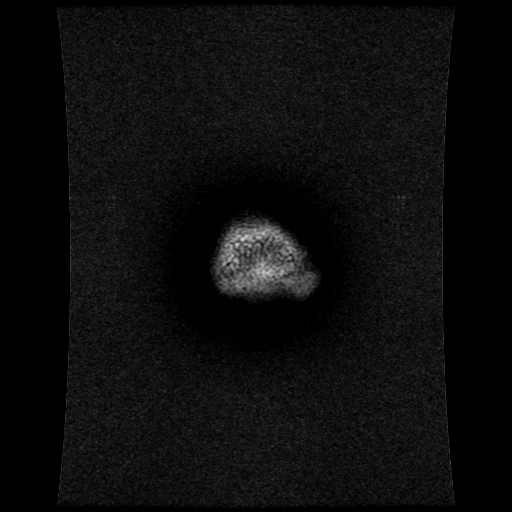
[im 31/31]
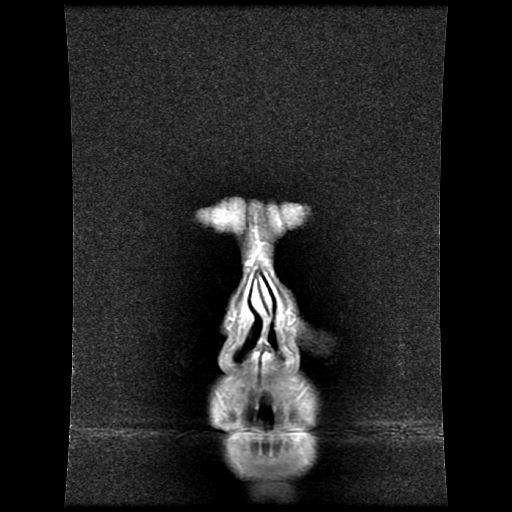

[Series 14: T1 post-contrast · sagittal · 5.0mm · 0.47mm/px · 2 of 27 slices shown (2 of 2)]
[im 1/27]
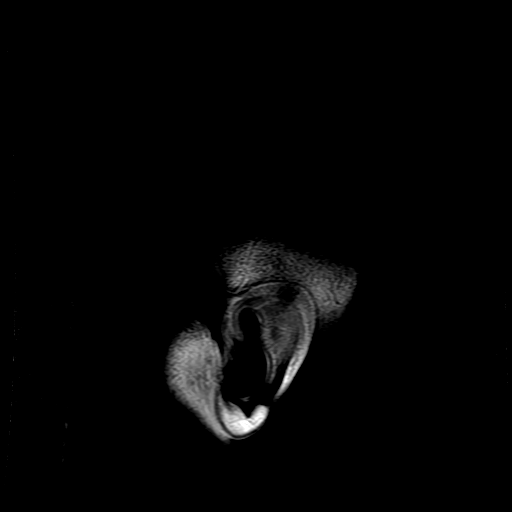
[im 27/27]
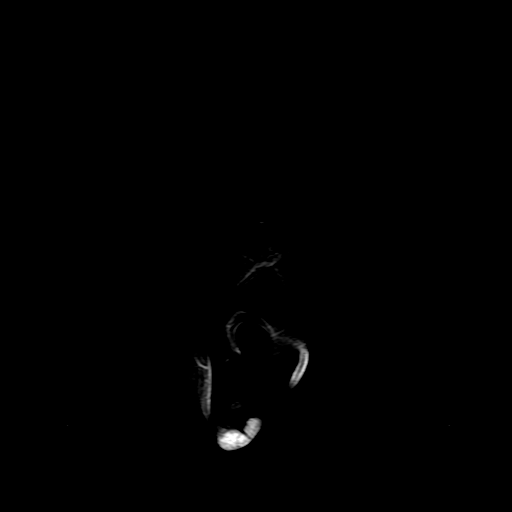

[Series 400: DWI · axial · 3.0mm · 1.09mm/px · z∈[-96,+71]mm · 4 of 57 slices shown (3 of 4)]
[im 1/57]
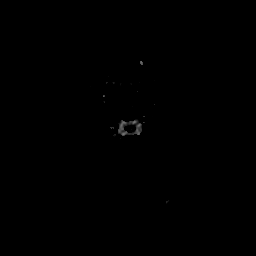
[im 19/57]
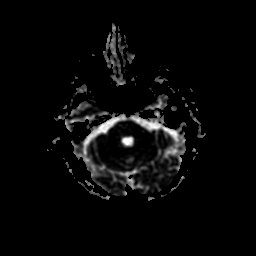
[im 38/57]
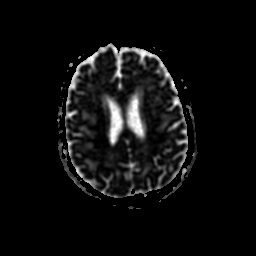
[im 57/57]
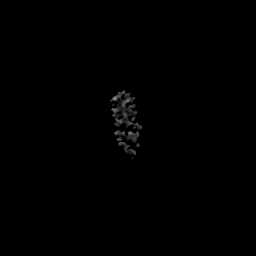

[Series 500: DWI · coronal · 5.0mm · 1.09mm/px · 3 of 37 slices shown (4 of 4)]
[im 1/37]
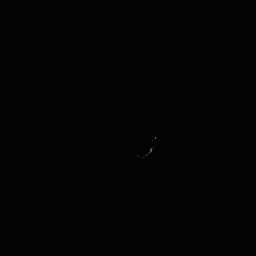
[im 19/37]
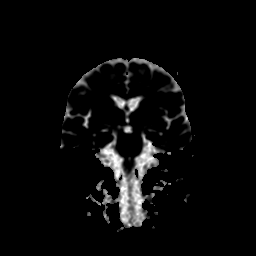
[im 37/37]
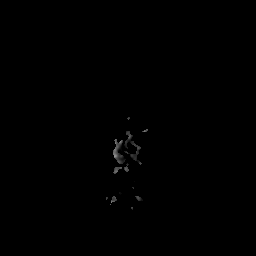

[33 of 48 positions shown; findings below may reference images not displayed]

FINDINGS: There is no evidence of acute infarct, intracranial hemorrhage,
mass, midline shift, or extra-axial fluid collection. Mild cerebral
atrophy is within normal limits for age. Patchy T2 hyperintensities
are present throughout the cerebral white matter, confluent in the
periatrial regions and nonspecific but compatible with extensive
chronic small vessel ischemic disease. 4.3 x 1.6 cm extra-axial CSF
space in the middle cranial fossa is consistent with an incidental
arachnoid cyst resulting in mild flattening of the temporal pole. No
abnormal enhancement is identified.

No suspicious skull lesions are seen. Orbits are unremarkable. A 5
mm cystic focus at the inferomedial aspect of the right external
auditory canal is benign in appearance. No significant inflammatory
disease is seen in the paranasal sinuses are mastoid air cells.
Major intracranial vascular flow voids are preserved.
IMPRESSION: 1. No evidence of intracranial metastases.
2. Extensive chronic small vessel ischemic disease.

## 2017-07-21 IMAGING — CR DG CHEST 1V
1 series · 1 of 1 positions shown · non-contrast
Comparison: Chest x-ray of 01/23/2016, and CT chest of 01/29/2016

CLINICAL DATA: History of lung carcinoma, rib right thoracentesis

EXAM:
CHEST 1 VIEW

[w chest pa]
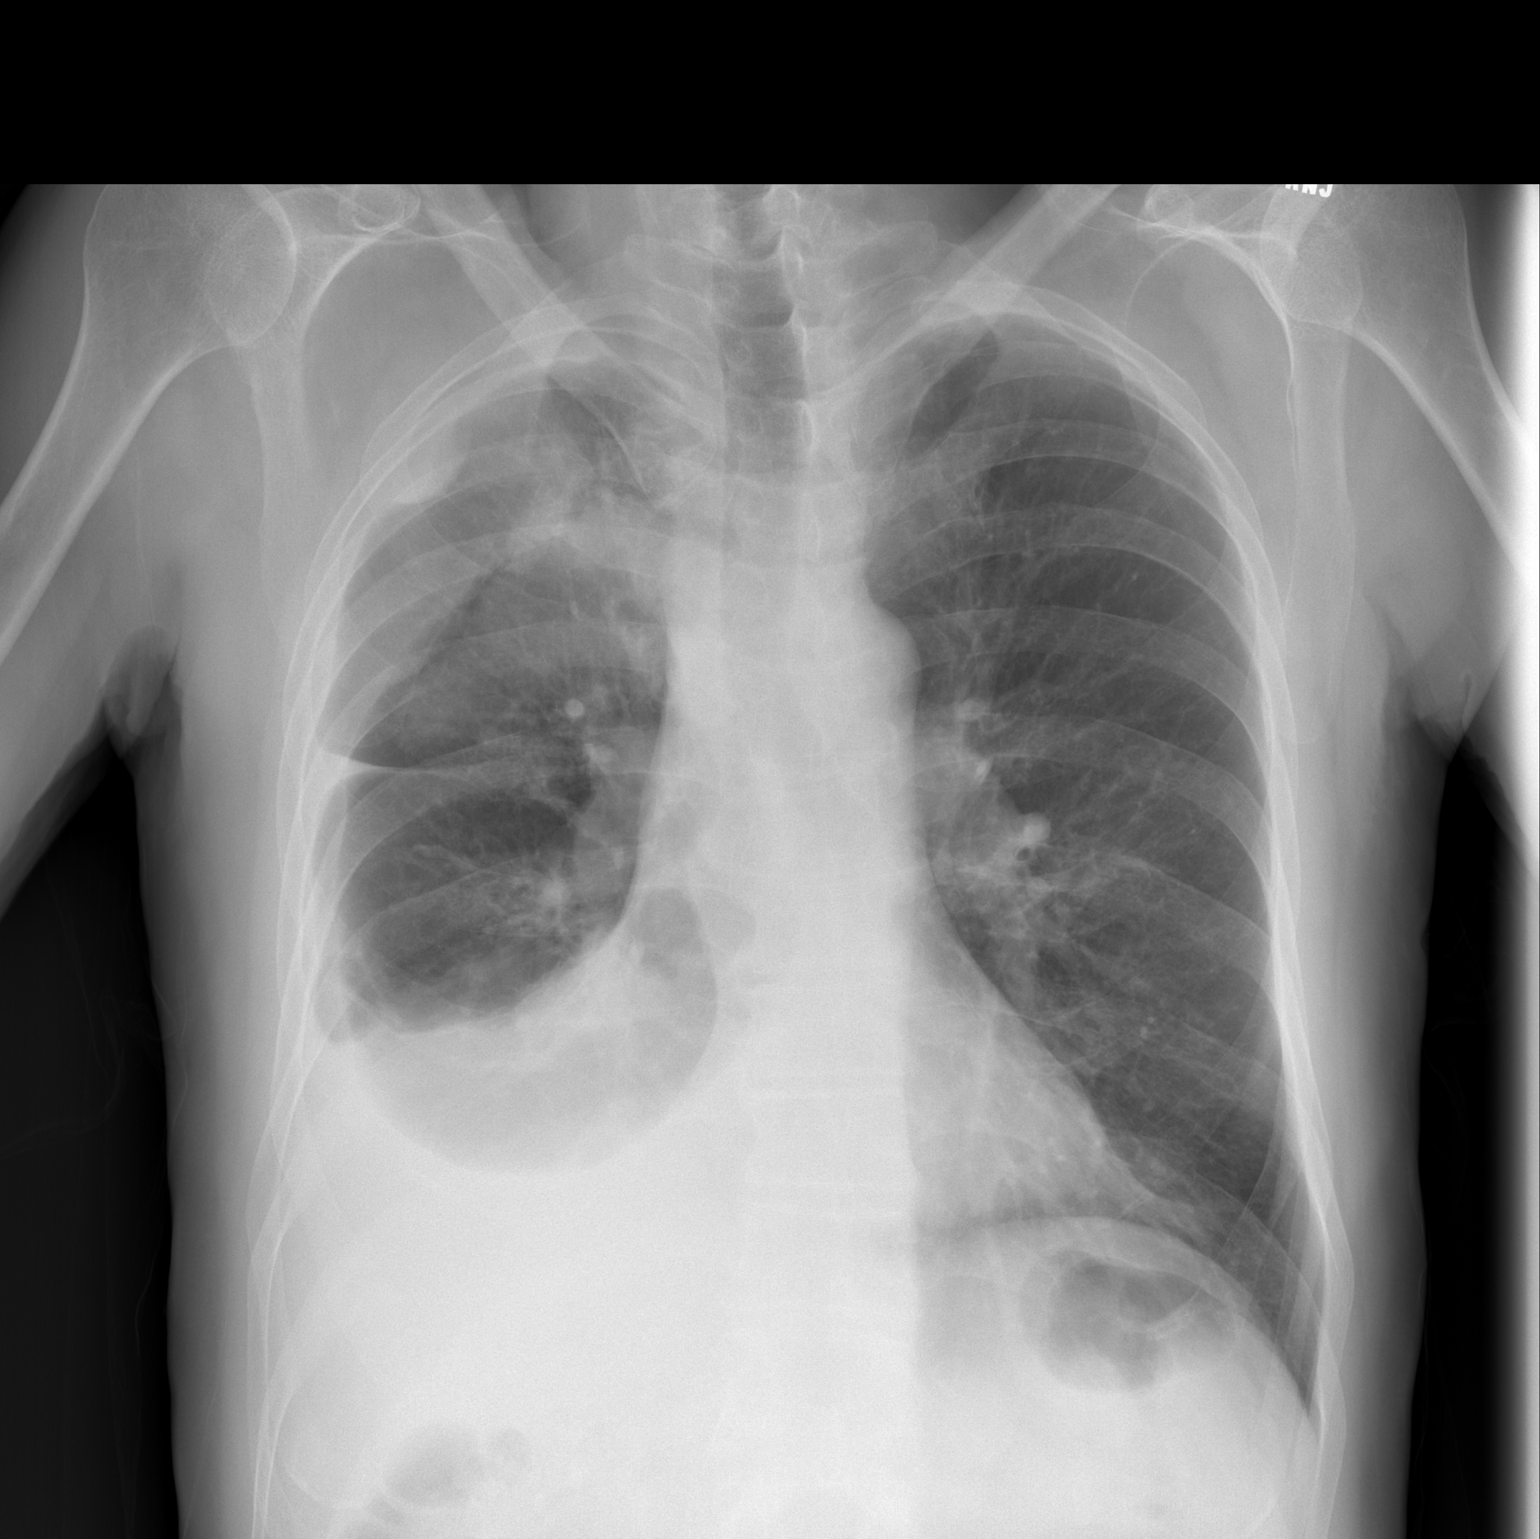

[1 of 1 positions shown; findings below may reference images not displayed]

FINDINGS: After right thoracentesis, some the right pleural effusion has been
evacuated. The masslike opacity noted by chest x-ray in the right
infrahilar region is not as well seen, being partially obscured by
fluid and volume loss. Probable loculated fluid is noted creating
haziness in the right upper hemi thorax. No pneumothorax is seen.
The left lung is clear.
IMPRESSION: 1. No pneumothorax after right thoracentesis.
2. Right pleural effusion. The right infrahilar mass is not well
seen.

## 2017-07-29 IMAGING — US US CHEST/MEDIASTINUM
1 series · 6 of 6 positions shown · non-contrast
Comparison: CT 02/26/2016

CLINICAL DATA: RIGHT pleural effusion.  RIGHT lung cancer.

EXAM:
CHEST ULTRASOUND

[Series 1: us chest/mediastinum · 0.26mm/px · 6 of 6 slices shown]
[im 1/6]
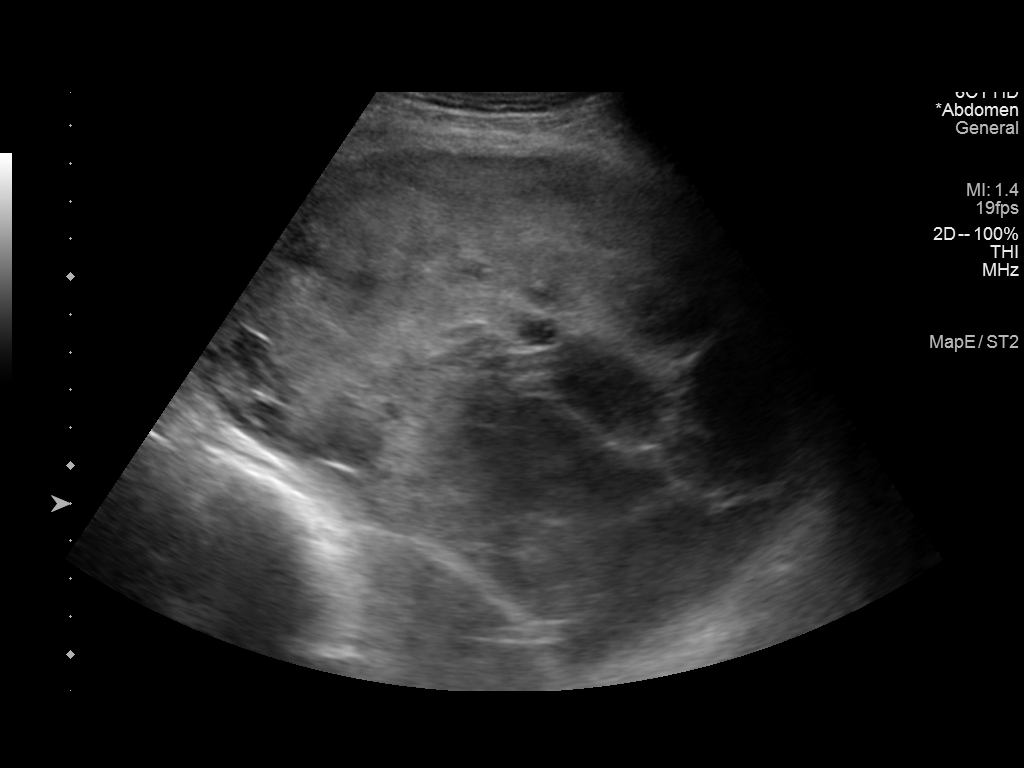
[im 2/6]
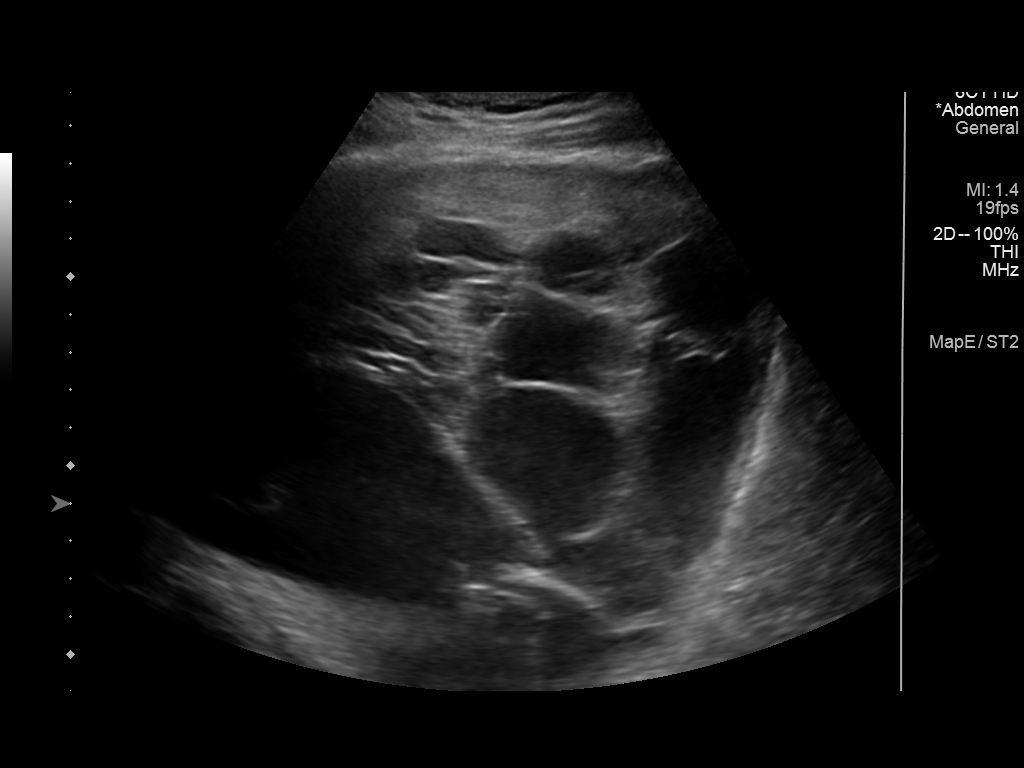
[im 3/6]
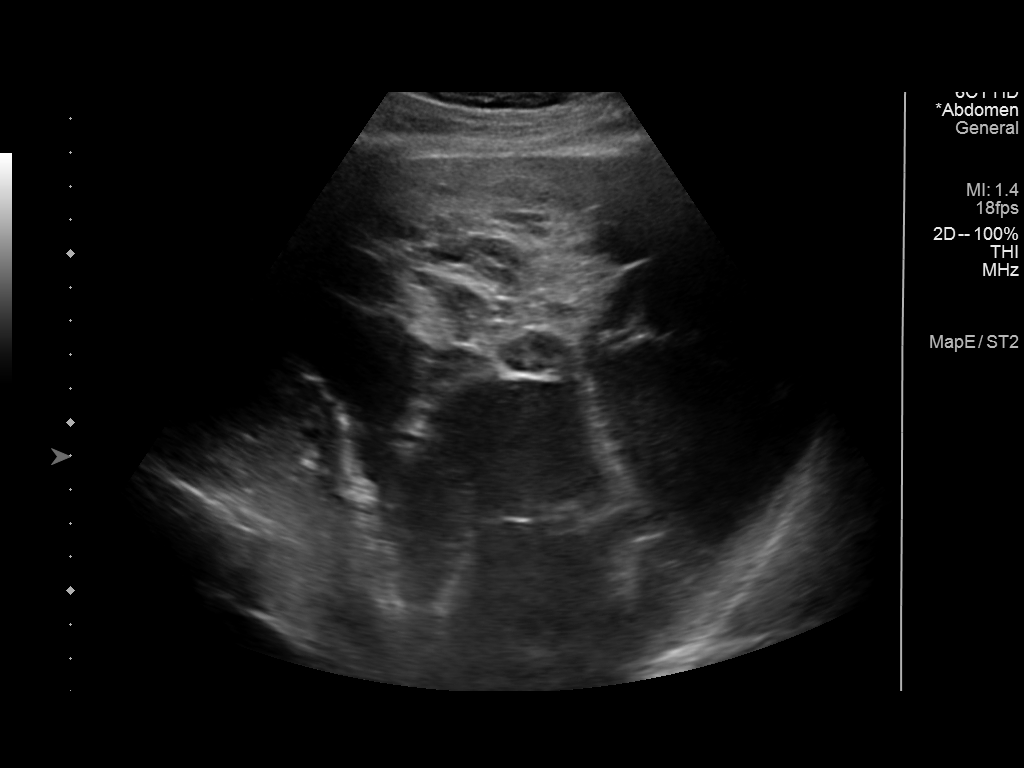
[im 4/6]
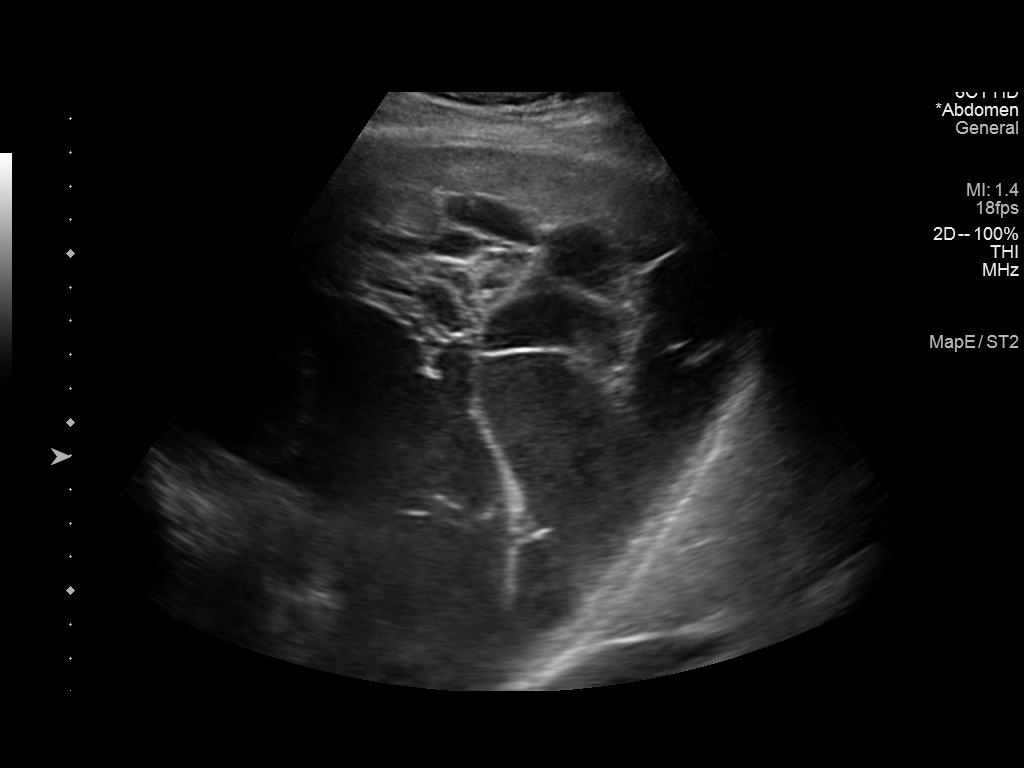
[im 5/6]
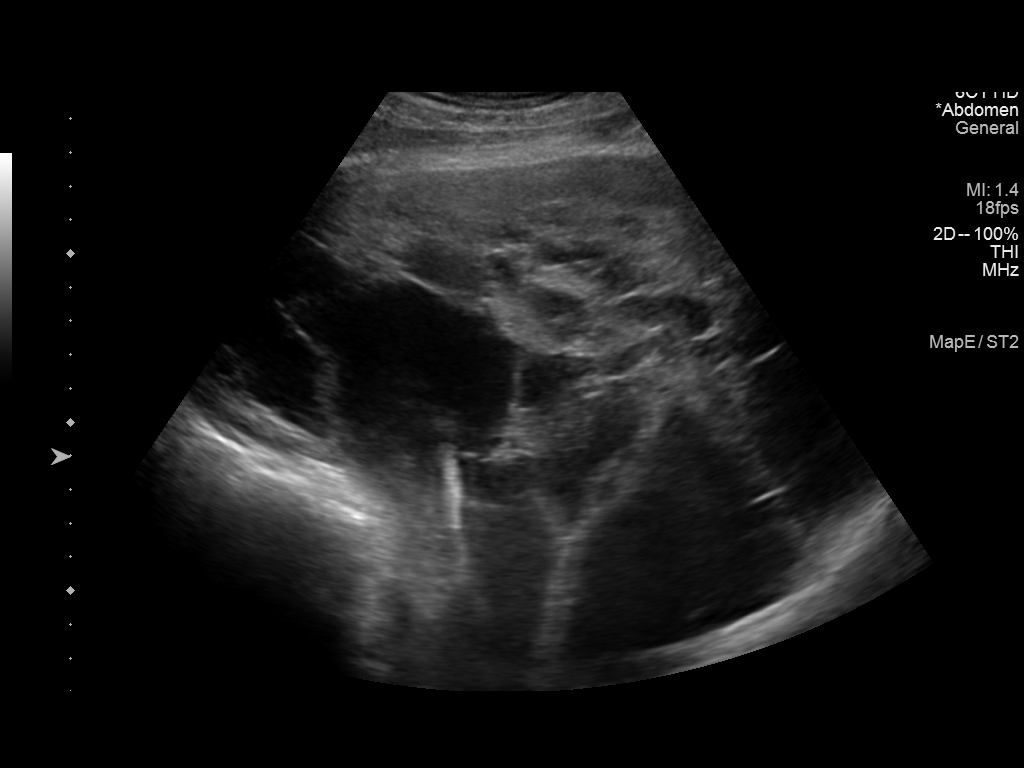
[im 6/6]
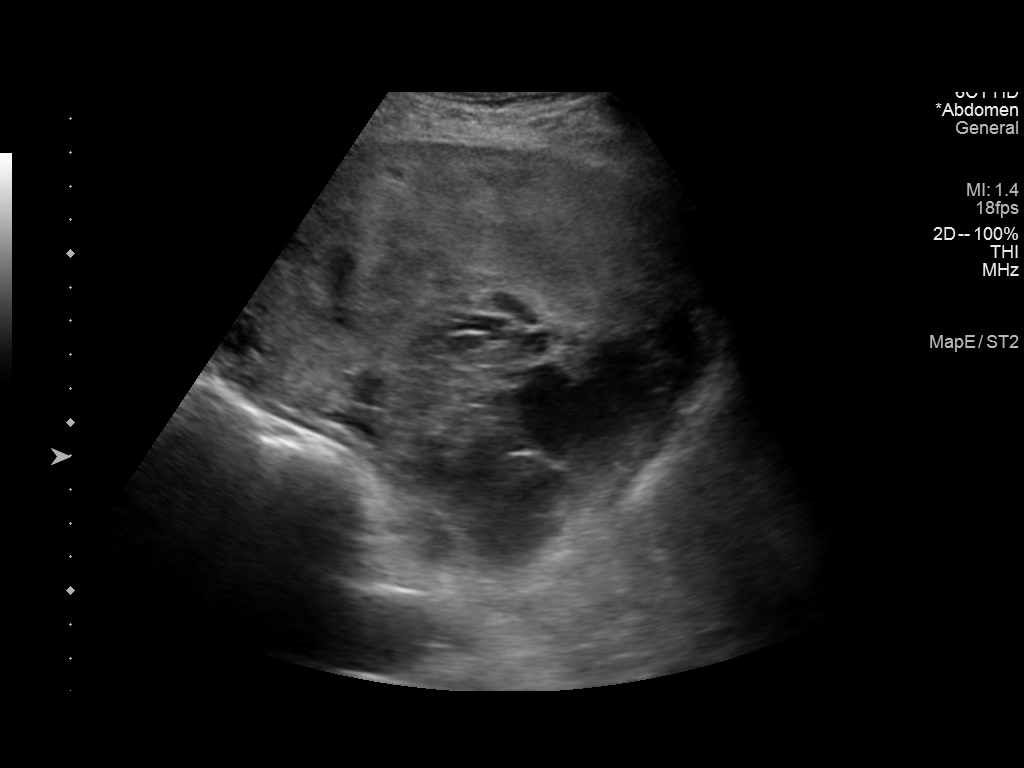

[6 of 6 positions shown; findings below may reference images not displayed]

FINDINGS: There is loculated multi-septated fluid collection within the RIGHT
hemi thorax. There is consolidation of the RIGHT lung. The multi
loculated pattern is prohibited of productive thoracentesis.
IMPRESSION: Multiloculated fluid collection within the RIGHT lower lobe with
consolidated lung. The multiloculated pattern is prohibited of
productive thoracentesis.
